# Patient Record
Sex: Female | Born: 1935 | Race: White | Hispanic: No | Marital: Married | Smoking: Former smoker
Health system: Southern US, Community
[De-identification: ages and names within clinical notes are randomized; demographics above are authoritative.]

## PROBLEM LIST (undated history)

## (undated) DIAGNOSIS — T4145XA Adverse effect of unspecified anesthetic, initial encounter: Secondary | ICD-10-CM

## (undated) DIAGNOSIS — E039 Hypothyroidism, unspecified: Secondary | ICD-10-CM

## (undated) DIAGNOSIS — E7800 Pure hypercholesterolemia, unspecified: Secondary | ICD-10-CM

## (undated) DIAGNOSIS — T8859XA Other complications of anesthesia, initial encounter: Secondary | ICD-10-CM

## (undated) DIAGNOSIS — E079 Disorder of thyroid, unspecified: Secondary | ICD-10-CM

## (undated) DIAGNOSIS — I10 Essential (primary) hypertension: Secondary | ICD-10-CM

## (undated) DIAGNOSIS — E785 Hyperlipidemia, unspecified: Secondary | ICD-10-CM

## (undated) DIAGNOSIS — M1990 Unspecified osteoarthritis, unspecified site: Secondary | ICD-10-CM

## (undated) DIAGNOSIS — G510 Bell's palsy: Secondary | ICD-10-CM

## (undated) HISTORY — PX: THYROID SURGERY: SHX805

## (undated) HISTORY — DX: Bell's palsy: G51.0

## (undated) HISTORY — PX: ABDOMINAL HYSTERECTOMY: SHX81

## (undated) HISTORY — PX: TONSILLECTOMY: SUR1361

---

## 1998-10-06 ENCOUNTER — Ambulatory Visit (HOSPITAL_COMMUNITY): Admission: RE | Admit: 1998-10-06 | Discharge: 1998-10-06 | Payer: Self-pay | Admitting: Endocrinology

## 1998-10-06 ENCOUNTER — Encounter: Payer: Self-pay | Admitting: Endocrinology

## 1998-11-03 ENCOUNTER — Encounter (INDEPENDENT_AMBULATORY_CARE_PROVIDER_SITE_OTHER): Payer: Self-pay | Admitting: Specialist

## 1998-11-03 ENCOUNTER — Ambulatory Visit (HOSPITAL_COMMUNITY): Admission: RE | Admit: 1998-11-03 | Discharge: 1998-11-03 | Payer: Self-pay

## 1999-10-14 ENCOUNTER — Emergency Department (HOSPITAL_COMMUNITY): Admission: EM | Admit: 1999-10-14 | Discharge: 1999-10-14 | Payer: Self-pay | Admitting: Emergency Medicine

## 1999-11-26 ENCOUNTER — Encounter: Admission: RE | Admit: 1999-11-26 | Discharge: 1999-11-26 | Payer: Self-pay | Admitting: Occupational Medicine

## 1999-11-26 ENCOUNTER — Encounter: Payer: Self-pay | Admitting: Occupational Medicine

## 2005-06-13 ENCOUNTER — Encounter: Admission: RE | Admit: 2005-06-13 | Discharge: 2005-06-13 | Payer: Self-pay | Admitting: Internal Medicine

## 2005-07-14 ENCOUNTER — Encounter: Admission: RE | Admit: 2005-07-14 | Discharge: 2005-07-14 | Payer: Self-pay | Admitting: Internal Medicine

## 2005-07-21 ENCOUNTER — Encounter: Admission: RE | Admit: 2005-07-21 | Discharge: 2005-07-21 | Payer: Self-pay | Admitting: Internal Medicine

## 2005-07-21 ENCOUNTER — Other Ambulatory Visit: Admission: RE | Admit: 2005-07-21 | Discharge: 2005-07-21 | Payer: Self-pay | Admitting: Interventional Radiology

## 2005-07-21 ENCOUNTER — Encounter (INDEPENDENT_AMBULATORY_CARE_PROVIDER_SITE_OTHER): Payer: Self-pay | Admitting: *Deleted

## 2005-09-09 ENCOUNTER — Ambulatory Visit (HOSPITAL_COMMUNITY): Admission: RE | Admit: 2005-09-09 | Discharge: 2005-09-10 | Payer: Self-pay | Admitting: General Surgery

## 2005-09-09 ENCOUNTER — Encounter (INDEPENDENT_AMBULATORY_CARE_PROVIDER_SITE_OTHER): Payer: Self-pay | Admitting: Specialist

## 2008-03-19 ENCOUNTER — Encounter: Admission: RE | Admit: 2008-03-19 | Discharge: 2008-03-19 | Payer: Self-pay | Admitting: Orthopedic Surgery

## 2008-03-20 ENCOUNTER — Ambulatory Visit (HOSPITAL_BASED_OUTPATIENT_CLINIC_OR_DEPARTMENT_OTHER): Admission: RE | Admit: 2008-03-20 | Discharge: 2008-03-21 | Payer: Self-pay | Admitting: Orthopedic Surgery

## 2010-04-15 LAB — BASIC METABOLIC PANEL
BUN: 14 mg/dL (ref 6–23)
Chloride: 105 mEq/L (ref 96–112)
GFR calc Af Amer: >60 mL/min (ref >60)
GFR calc non Af Amer: >60 mL/min (ref >60)
Potassium: 4.2 mEq/L (ref 3.5–5.1)
Sodium: 139 mEq/L (ref 135–145)

## 2010-04-15 LAB — POCT HEMOGLOBIN-HEMACUE: Hemoglobin: 13.1 g/dL (ref 12.0–15.0)

## 2010-05-18 NOTE — Op Note (Signed)
NAMEMarland Santiago  SHYANE, FOSSUM            ACCOUNT NO.:  [id number]   MEDICAL RECORD NO.:  [id number]          PATIENT TYPE:  AMB   LOCATION:  DSC                          FACILITY:  [facility]   PHYSICIAN:  Loreta Ave, M.D. DATE OF BIRTH:  10-27-1934   DATE OF PROCEDURE:  03/20/2008  DATE OF DISCHARGE:                               OPERATIVE REPORT   PREOPERATIVE DIAGNOSES:  Right shoulder impingement, rotator cuff tear,  and distal clavicle osteolysis.   POSTOPERATIVE DIAGNOSES:  Right shoulder impingement, rotator cuff tear,  and distal clavicle osteolysis with superior labral tear.   PROCEDURES:  1. Right shoulder exam under anesthesia arthroscopy, debridement of      labrum and rotator cuff.  2. Bursectomy.  3. Acromioplasty.  4. CA ligament release.  5. Excision of distal clavicle.  6. Arthroscopic assisted rotator cuff repair fiber weave suture x2,      swivel lock anchors x2.   SURGEON:  Loreta Ave, MD   ASSISTANT:  Genene Churn. Barry Dienes, [place], present throughout the entire case and  necessary for timely completion of procedure.   ANESTHESIA:  General.   BLOOD LOSS:  Minimal.   SPECIMENS:  None.   CULTURES:  None.   COMPLICATIONS:  None.   DRESSINGS:  Soft compressive shoulder immobilizer.   PROCEDURE:  The patient brought to the operating room, placed on  operating table in supine position.  After adequate anesthesia had been  obtained, shoulder examined.  Full motion, stable shoulder.  Placed in  beach-chair position on the shoulder positioner, prepped and draped in  usual sterile fashion.  Three portals created, one each anterior,  posterior, and lateral.  Shoulder entered with blunt obturator.  Arthroscope was introduced, shoulder distended and inspected.  Superior  complex tearing labrum debrided.  Biceps tendon, biceps anchor intact.  Some grade 2 and 3 focal change on the glenoid, but most of joint looked  fairly good.  Capsule ligamentous structures intact.   Rotator cuff  complete full-thickness tear of supraspinatus tendon throughout the  entire crescent region appeared very reparable.  Debrided.  Cannula  redirected subacromially.  Type II-III acromion.  Bursa was resected.  Acromioplasty to a type-I acromion released CA ligament with cautery.  Distal clavicle exposed.  Grade 4 changes with spurs.  Periarticular  spurs and lateral centimeter of clavicle were resected.  Adequacy of  decompression confirmed viewing throughout.  Tuberosity roughened to  bleeding bone.  The lateral portal was opened for a cannula.  The cuff  was captured with 2 horizontal fiber weave mattress sutures with the  Scorpion device.  The suture brought out anterior and lateral.  There  were then sequentially repaired down the tuberosity with pre-punching a  drill hole, attached to the swivel lock anchor and swivel lock anchor  firmly anchoring this down tensioning the suture.  At completion, nice  firm repair, watertight closure, and decompression confirmed throughout.  Instruments and fluid removed.  Portals shoulder and bursa injected  Marcaine.  Portals were closed with 4-0 nylon.  Sterile compressive  dressing applied.  Anesthesia reversed.  Brought to recovery room.  Tolerated  surgery well.  No complications.      Loreta Ave, M.D.  Electronically Signed     DFM/MEDQ  D:  03/20/2008  T:  03/21/2008  Job:  [id number]

## 2010-05-21 NOTE — Op Note (Signed)
NAMEMarland Kitchen  Tammie Santiago, Tammie Santiago            ACCOUNT NO.:  [id number]   MEDICAL RECORD NO.:  [id number]          PATIENT TYPE:  OIB   LOCATION:  1613                         FACILITY:  [facility]   PHYSICIAN:  Ollen Gross. Vernell Morgans, M.D. DATE OF BIRTH:  1934/11/02   DATE OF PROCEDURE:  09/09/2005  DATE OF DISCHARGE:  09/10/2005                                 OPERATIVE REPORT   PREOPERATIVE DIAGNOSIS:  Left thyroid mass.   POSTOPERATIVE DIAGNOSIS:  Left thyroid mass.   PROCEDURE:  Left thyroid lobectomy.   SURGEON:  Ollen Gross. Carolynne Edouard, M.D.   ASSISTANT:  Gita Kudo, M.D.   ANESTHESIA:  General endotracheal.   PROCEDURE:  After informed consent was obtained, the patient was brought to  the operating room, placed in supine position on the operating table.  After  adequate induction of general anesthesia, the patient's neck and chest area  were prepped with Betadine and draped in usual sterile manner.  Prior to  prepping, a roll was placed behind the patient's shoulders to extend the  neck a little bit.  The patient was then prepped and draped in usual sterile  manner.  A low transverse incision was made approximately one finger breadth  above the clavicles and two fingerbreadths above the sternal notch.  This  incision was carried was then made with a 15 blade knife and carried down  through the skin, subcutaneous tissue and platysma sharply with  electrocautery.  The platysma was then grasped with Allis clamps and  subplatysmal flaps were created superiorly and inferiorly.  A Mahorner  retractor was then deployed.  The dissection was then carried down sharply  with electrocautery along the midline and neck until the trachea was  identified.  The patient did not appear to have an isthmus of her thyroid  gland.  She had had previous minimally invasive parathyroidectomy of the  left inferior parathyroid adenoma in the past.  The patient did have some  scar tissue along the anterior and lateral  surface of the left lobe of the  thyroid gland because of this.  The strap muscles of the neck were retracted  laterally with [employer] retractor.  Some blunt dissection was carried out  along the anterior surface of the thyroid gland with a Kitner and some sharp  dissection also with the electrocautery to separate the muscles from the  anterior surface of the thyroid gland.  Once this was accomplished, a finger  was able to be inserted bluntly around the edge of the thyroid gland and the  thyroid gland was rotated medially and anteriorly.  As this was done several  small vessels on the lateral edge of the thyroid gland were controlled with  small vascular clips.  The superior pole of the thyroid gland was also  dissected circumferentially bluntly with a right-angle clamp and the  superior pole was controlled with 2-0 silk ties and medium vascular clip on  the stay side and the thyroid gland was divided between the two ties.  Once  this was accomplished, the thyroid gland was able to be rolled more medially  and anteriorly.  The area of the recurrent laryngeal nerve was identified  and care was taken to keep the dissection away from the structure.  It was  felt that we did identify a left superior parathyroid gland and this was  also kept well out of the dissection area.  The left lobe of the thyroid  gland was then mobilized off of the trachea sharply with electrocautery.  Once this was accomplished, the left lobe was removed from the patient and  sent to pathology for frozen sections which favored a benign process from  the pathologist.  The wound was then irrigated copious amounts of saline.  The area of the operative bed appeared to be completely hemostatic.  A small  piece of Surgicel was placed in the operative bed and pressure was held for  several minutes.  The bed was then reexamined and still felt to be  completely hemostatic.  The right lobe of the thyroid gland was palpated and   felt to be normal.  At this point the strap muscles were reapproximated with  interrupted 3-0 Vicryl stitches.  The platysma was also reapproximated with  interrupted 3-0 Vicryl stitches and the skin was closed with a running 4-0  Monocryl subcuticular stitch.  Benzoin, Steri-Strips and sterile dressings  were applied.  The patient tolerated well.  At the end of case all needle,  sponge and instrument counts were correct.  The patient was awakened and  taken to recovery in stable condition.      Ollen Gross. Vernell Morgans, M.D.  Electronically Signed     PST/MEDQ  D:  09/12/2005  T:  09/12/2005  Job:  [id number]

## 2010-06-17 ENCOUNTER — Ambulatory Visit
Admission: RE | Admit: 2010-06-17 | Discharge: 2010-06-17 | Disposition: A | Discharge status: Home / Self Care | Payer: Medicare Other | Source: Ambulatory Visit | Attending: Internal Medicine | Admitting: Internal Medicine

## 2010-06-17 ENCOUNTER — Other Ambulatory Visit: Payer: Self-pay | Admitting: Internal Medicine

## 2010-06-17 DIAGNOSIS — M25552 Pain in left hip: Secondary | ICD-10-CM

## 2011-06-06 ENCOUNTER — Other Ambulatory Visit: Payer: Self-pay | Admitting: Sports Medicine

## 2011-06-06 DIAGNOSIS — M545 Low back pain: Secondary | ICD-10-CM

## 2011-06-12 ENCOUNTER — Ambulatory Visit
Admission: RE | Admit: 2011-06-12 | Discharge: 2011-06-12 | Disposition: A | Discharge status: Home / Self Care | Payer: Medicare Other | Source: Ambulatory Visit | Attending: Sports Medicine | Admitting: Sports Medicine

## 2011-06-12 DIAGNOSIS — M545 Low back pain: Secondary | ICD-10-CM

## 2012-09-03 DIAGNOSIS — G510 Bell's palsy: Secondary | ICD-10-CM

## 2012-09-03 HISTORY — DX: Bell's palsy: G51.0

## 2012-09-08 ENCOUNTER — Emergency Department (HOSPITAL_COMMUNITY): Payer: Medicare Other

## 2012-09-08 ENCOUNTER — Emergency Department (HOSPITAL_COMMUNITY)
Admission: EM | Admit: 2012-09-08 | Discharge: 2012-09-08 | Disposition: A | Discharge status: Home / Self Care | Payer: Medicare Other | Attending: Emergency Medicine | Admitting: Emergency Medicine

## 2012-09-08 ENCOUNTER — Encounter (HOSPITAL_COMMUNITY): Payer: Self-pay | Admitting: Emergency Medicine

## 2012-09-08 DIAGNOSIS — E079 Disorder of thyroid, unspecified: Secondary | ICD-10-CM | POA: Insufficient documentation

## 2012-09-08 DIAGNOSIS — H9209 Otalgia, unspecified ear: Secondary | ICD-10-CM | POA: Insufficient documentation

## 2012-09-08 DIAGNOSIS — IMO0002 Reserved for concepts with insufficient information to code with codable children: Secondary | ICD-10-CM | POA: Insufficient documentation

## 2012-09-08 DIAGNOSIS — E7800 Pure hypercholesterolemia, unspecified: Secondary | ICD-10-CM | POA: Insufficient documentation

## 2012-09-08 DIAGNOSIS — G510 Bell's palsy: Secondary | ICD-10-CM | POA: Insufficient documentation

## 2012-09-08 DIAGNOSIS — I10 Essential (primary) hypertension: Secondary | ICD-10-CM | POA: Insufficient documentation

## 2012-09-08 HISTORY — DX: Essential (primary) hypertension: I10

## 2012-09-08 HISTORY — DX: Pure hypercholesterolemia, unspecified: E78.00

## 2012-09-08 HISTORY — DX: Disorder of thyroid, unspecified: E07.9

## 2012-09-08 LAB — BASIC METABOLIC PANEL
BUN: 15 mg/dL (ref 6–23)
CO2: 26 mEq/L (ref 19–32)
Chloride: 102 mEq/L (ref 96–112)
GFR calc Af Amer: >90 mL/min (ref >90)
Potassium: 3.7 mEq/L (ref 3.5–5.1)

## 2012-09-08 LAB — CBC WITH DIFFERENTIAL/PLATELET
Basophils Relative: 0 % (ref 0–1)
HCT: 36.9 % (ref 36.0–46.0)
Hemoglobin: 13.3 g/dL (ref 12.0–15.0)
Lymphocytes Relative: 12 % (ref 12–46)
MCHC: 36 g/dL (ref 30.0–36.0)
Monocytes Relative: 6 % (ref 3–12)
Neutro Abs: 5.5 10*3/uL (ref 1.7–7.7)
Neutrophils Relative %: 80 % — ABNORMAL HIGH (ref 43–77)
RBC: 4.14 MIL/uL (ref 3.87–5.11)
WBC: 6.9 10*3/uL (ref 4.0–10.5)

## 2012-09-08 MED ORDER — POLYVINYL ALCOHOL 1.4 % OP SOLN: 2.0000 [drp] | OPHTHALMIC | Status: DC | PRN

## 2012-09-08 MED ORDER — PREDNISONE 10 MG PO TABS: 40.0000 mg | ORAL_TABLET | Freq: Every day | ORAL | Status: DC

## 2012-09-08 NOTE — ED Notes (Signed)
ED MD made aware of pt's s/s and last seen normal time

## 2012-09-08 NOTE — ED Notes (Signed)
MD at bedside. 

## 2012-09-08 NOTE — ED Notes (Signed)
Pt. Stated, I think I've had a mini stroke during the night.  When i got up this am and ate breakfast that was fine, but when i drank coffee i noticed it was dripping out of the side of my mouth on the right side and my eye on the right side was watery.  i have had an ear infection which he gave me some ear drops.  No other problem notice by pt. Pt. Stated i got out and walked the cat then decided to come to the hospital.

## 2012-09-08 NOTE — ED Provider Notes (Signed)
CSN: [id number]     Arrival date & time 09/08/12  1132 History   First MD Initiated Contact with Patient 09/08/12 1156     Chief Complaint  Patient presents with  . Weakness   (Consider location/radiation/quality/duration/timing/severity/associated sxs/prior Treatment) The history is provided by the patient and the spouse.   78 year old female. Noted the right-sided facial drooping this morning exact onset of the time not clear believe it to be around 9:30 when she was drinking something and noticed that it was truly in and of her her dripping out of the right side of her mouth. Patient had been up for a little while at this point in time. Husband feels that his speech is slightly slurred. Denies any headache visual changes arm weakness leg weakness or any numbness. No history of stroke in the past.  Past Medical History  Diagnosis Date  . High cholesterol   . Hypertension   . Thyroid disease    History reviewed. No pertinent past surgical history. No family history on file. History  Substance Use Topics  . Smoking status: Not on file  . Smokeless tobacco: Not on file  . Alcohol Use: Yes   OB History   Grav Para Term Preterm Abortions TAB SAB Ect Mult Living                 Review of Systems  Constitutional: Negative for fever.  HENT: Positive for ear pain.   Eyes: Negative for redness.  Respiratory: Negative for shortness of breath.   Cardiovascular: Negative for chest pain.  Gastrointestinal: Negative for abdominal pain.  Genitourinary: Negative for dysuria.  Musculoskeletal: Negative for back pain.  Skin: Negative for rash.  Neurological: Positive for facial asymmetry, speech difficulty and weakness. Negative for headaches.  Hematological: Does not bruise/bleed easily.  Psychiatric/Behavioral: Negative for confusion.    Allergies  Review of patient's allergies indicates not on file.  Home Medications   Current Outpatient Rx  Name  Route  Sig  Dispense  Refill  .  polyvinyl alcohol (LIQUIFILM TEARS) 1.4 % ophthalmic solution   Right Eye   Place 2 drops into the right eye as needed.   15 mL   2   . predniSONE (DELTASONE) 10 MG tablet   Oral   Take 4 tablets (40 mg total) by mouth daily.   20 tablet   0    BP 142/59  Pulse 86  Temp(Src) 99 F (37.2 C) (Oral)  Resp 21  SpO2 96% Physical Exam  Nursing note and vitals reviewed. Constitutional: She is oriented to person, place, and time. She appears well-developed and well-nourished. No distress.  HENT:  Head: Normocephalic and atraumatic.  Right Ear: External ear normal.  Left Ear: External ear normal.  Mouth/Throat: Oropharynx is clear and moist.  Eyes: Conjunctivae and EOM are normal. Pupils are equal, round, and reactive to light.  Drooping of the right eyelid noted  Neck: Normal range of motion.  Cardiovascular: Normal rate, regular rhythm and normal heart sounds.   Pulmonary/Chest: Effort normal and breath sounds normal.  Abdominal: Soft. Bowel sounds are normal. There is no tenderness.  Musculoskeletal: Normal range of motion.  Lymphadenopathy:    She has no cervical adenopathy.  Neurological: She is alert and oriented to person, place, and time. She exhibits normal muscle tone. Coordination normal.  Right facial nerve partial paralysis noted. Does involve the 4 head as well. No other neurological deficits.  Skin: Skin is warm. No rash noted.    ED  Course  Procedures (including critical care time) Labs Review Labs Reviewed  CBC WITH DIFFERENTIAL - Abnormal; Notable for the following:    Neutrophils Relative % 80 (*)    All other components within normal limits  BASIC METABOLIC PANEL - Abnormal; Notable for the following:    Glucose, Bld 137 (*)    GFR calc non Af Amer 81 (*)    All other components within normal limits   Results for orders placed during the hospital encounter of 09/08/12  CBC WITH DIFFERENTIAL      Result Value Range   WBC 6.9  4.0 - 10.5 K/uL   RBC  4.14  3.87 - 5.11 MIL/uL   Hemoglobin 13.3  12.0 - 15.0 g/dL   HCT 78.2  95.6 - 21.3 %   MCV 89.1  78.0 - 100.0 fL   MCH 32.1  26.0 - 34.0 pg   MCHC 36.0  30.0 - 36.0 g/dL   RDW 08.6  57.8 - 46.9 %   Platelets 201  150 - 400 K/uL   Neutrophils Relative % 80 (*) 43 - 77 %   Neutro Abs 5.5  1.7 - 7.7 K/uL   Lymphocytes Relative 12  12 - 46 %   Lymphs Abs 0.8  0.7 - 4.0 K/uL   Monocytes Relative 6  3 - 12 %   Monocytes Absolute 0.4  0.1 - 1.0 K/uL   Eosinophils Relative 2  0 - 5 %   Eosinophils Absolute 0.1  0.0 - 0.7 K/uL   Basophils Relative 0  0 - 1 %   Basophils Absolute 0.0  0.0 - 0.1 K/uL  BASIC METABOLIC PANEL      Result Value Range   Sodium 140  135 - 145 mEq/L   Potassium 3.7  3.5 - 5.1 mEq/L   Chloride 102  96 - 112 mEq/L   CO2 26  19 - 32 mEq/L   Glucose, Bld 137 (*) 70 - 99 mg/dL   BUN 15  6 - 23 mg/dL   Creatinine, Ser 6.29  0.50 - 1.10 mg/dL   Calcium 9.5  8.4 - 52.8 mg/dL   GFR calc non Af Amer 81 (*) >90 mL/min   GFR calc Af Amer >90  >90 mL/min    . Date: 09/08/2012  Rate: 109  Rhythm: sinus tachycardia  QRS Axis: normal  Intervals: normal  ST/T Wave abnormalities: nonspecific ST/T changes  Conduction Disutrbances:none  Narrative Interpretation:   Old EKG Reviewed: none available    Imaging Review Ct Head Wo Contrast  09/08/2012   *RADIOLOGY REPORT*  Clinical Data: Weakness. Possible right facial droop.  CT HEAD WITHOUT CONTRAST  Technique:  Contiguous axial images were obtained from the base of the skull through the vertex without contrast.  Comparison: None.  Findings: Ventricles, cisterns and others CSF spaces are within normal.  There is no mass, mass effect, shift midline structures or acute hemorrhage.  There is no definite evidence to suggest acute infarction.  Remaining bones and soft tissues are unremarkable.  IMPRESSION: No acute intracranial findings.   Original Report Authenticated By: Elberta Fortis, M.D.    MDM   1. Bell's palsy    The  patient's presentation and findings consistent with right-sided facial nerve palsy. Otherwise known as Bell's palsy. Head CT negative patient has no other neurological symptoms other than a partial paralysis on the right side of her face to include the 4 head. Patient will be treated with prednisone his primary care Dr.  followup neurology referral provided with tears provided for the right eye. Currently patient is able to close her right eye patient wears a contact in the left but not the right so removal of the contact is not necessary. Patient will return for any newer worse symptoms.    Shelda Jakes, MD 09/08/12 [id number]

## 2012-12-21 ENCOUNTER — Other Ambulatory Visit: Payer: Self-pay | Admitting: Orthopedic Surgery

## 2012-12-21 DIAGNOSIS — M25512 Pain in left shoulder: Secondary | ICD-10-CM

## 2012-12-30 ENCOUNTER — Ambulatory Visit
Admission: RE | Admit: 2012-12-30 | Discharge: 2012-12-30 | Disposition: A | Discharge status: Home / Self Care | Payer: Medicare Other | Source: Ambulatory Visit | Attending: Orthopedic Surgery | Admitting: Orthopedic Surgery

## 2012-12-30 DIAGNOSIS — M25512 Pain in left shoulder: Secondary | ICD-10-CM

## 2013-01-03 HISTORY — PX: OTHER SURGICAL HISTORY: SHX169

## 2013-06-04 ENCOUNTER — Ambulatory Visit (INDEPENDENT_AMBULATORY_CARE_PROVIDER_SITE_OTHER): Payer: Medicare Other

## 2013-06-04 VITALS — Resp 15 | Ht 64.0 in | Wt 155.0 lb

## 2013-06-04 DIAGNOSIS — R52 Pain, unspecified: Secondary | ICD-10-CM

## 2013-06-04 DIAGNOSIS — M778 Other enthesopathies, not elsewhere classified: Secondary | ICD-10-CM

## 2013-06-04 DIAGNOSIS — M7750 Other enthesopathy of unspecified foot: Secondary | ICD-10-CM

## 2013-06-04 DIAGNOSIS — M2010 Hallux valgus (acquired), unspecified foot: Secondary | ICD-10-CM

## 2013-06-04 DIAGNOSIS — M779 Enthesopathy, unspecified: Secondary | ICD-10-CM

## 2013-06-04 NOTE — Progress Notes (Signed)
   Subjective:    Patient ID: Tammie Santiago, female    DOB: 1934-08-12, 79 y.o.   MRN: [id number]  HPI Comments: N lump L left 1st MPJ lateral D over 3 years O gradually increasing in size and tenderness C soft lump, painful  A enclosed shoes T wider shoes     Review of Systems  All other systems reviewed and are negative.      Objective:   Physical Exam 79 year old female presents this time well-developed well-nourished right history with a complaint of painful first MTP area left foot there's soft tissue cystic change over that area as well the dura bursal sac or ganglion cyst of the first MTP area left the painful tender symptomatically walking to his enclosed shoes.  Lower extremity objective findings as follows vascular status appears to be intact with pedal pulses palpable DP and PT +2/4 capillary fill time 3-4 seconds skin temperature warm turgor normal no edema rubor pallor or varicosities noted neurologically epicritic and proprioceptive sensations intact and symmetric bilateral is normal plantar response DTRs not listed neurologically skin color pigment hair growth are normal orthopedic biomechanical exam rectus foot type ankle mid tarsus subtalar joint motion is normal there is normal plantar response DTRs were noted as mentioned on orthopedic exam x-rays reveal notable HAV deformity I am angle greater than 12-14 hallux abductus angle greater than 2530 there is prominence of hypertrophy and cystic changes the first metatarsal head dorsally and medially there is also palpable soft tissue mass or bursal sac over the first MTP area left foot is bunion although not as severe there is also some flexible digital contractures lateral digits.       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 of her is a ganglion cyst at this time dispensed some bunion shields and cushion pads and silicone bunion shield was dispensed  literature on bunion surgery given to patient for to consider reappointed or continues for future followup and surgical consultation she is ready patient is a candidate for possible surgical intervention with Eliberto Ivory bunionectomy at some point in the future in the interim maintain accommodative shoes and entire constrictive shoes also advised that she can have conservative care white coming shoes steroid injections be beneficial at this time however the did dispense some padding reappointed to me for further consultation surgical option discussion. Next  Alvan Dame DPM

## 2013-06-04 NOTE — Patient Instructions (Signed)
Bunionectomy A bunionectomy is surgery to remove a bunion. A bunion is an enlargement of the joint at the base of the big toe. It is made up of bone and soft tissue on the inside part of the joint. Over time, a painful lump appears on the inside of the joint. The big toe begins to point inward toward the second toe. New bone growth can occur and a bone spur may form. The pain eventually causes difficulty walking. A bunion usually results from inflammation caused by the irritation of poorly fitting shoes. It often begins later in life. A bunionectomy is performed when nonsurgical treatment no longer works. When surgery is needed, the extent of the procedure will depend on the degree of deformity of the foot. Your surgeon will discuss with you the different procedures and what will work best for you depending on your age and health. LET YOUR CAREGIVER KNOW ABOUT:   Previous problems with anesthetics or medicines used to numb the skin.  Allergies to dyes, iodine, foods, and/or latex.  Medicines taken including herbs, eye drops, prescription medicines (especially medicines used to "thin the blood"), aspirin and other over-the-counter medicines, and steroids (by mouth or as a cream).  History of bleeding or blood problems.  Possibility of pregnancy, if this applies.  History of blood clots in your legs and/or lungs .  Previous surgery.  Other important health problems. RISKS AND COMPLICATIONS   Infection.  Pain.  Nerve damage.  Possibility that the bunion will recur. BEFORE THE PROCEDURE  You should be present 60 minutes prior to your procedure or as directed.  PROCEDURE  Surgery is often done so that you can go home the same day (outpatient). It may be done in a hospital or in an outpatient surgical center. An anesthetic will be used to help you sleep during the procedure. Sometimes, a spinal anesthetic is used to make you numb below the waist. A cut (incision) is made over the swollen  area at the first joint of the big toe. The enlarged lump will be removed. If there is a need to reposition the bones of the big toe, this may require more than 1 incision. The bone itself may need to be cut. Screws and wires may be used in the repair. These can be removed at a later date. In severe cases, the entire joint may need to be removed and a joint replacement inserted. When done, the incision is closed with stitches (sutures). Skin adhesive strips may be added for reinforcement. They help hold the incision closed.  AFTER THE PROCEDURE  Compression bandages (dressings) are then wrapped around the wound. This helps to keep the foot in alignment and reduce swelling. Your foot will be monitored for bleeding and swelling. You will need to stay for a few hours in the recovery area before being discharged. This allows time for the anesthesia to wear off. You will be discharged home when you are awake, stable, and doing well. HOME CARE INSTRUCTIONS   You can expect to return to normal activities within 6 to 8 weeks after surgery. The foot is at increased risk for swelling for several months. When you can expect to bear weight on the operated foot will depend on the extent of your surgery. The milder the deformity, the less tissue is removed and the sooner the return to normal activity level. During the recovery period, a special shoe, boot, or cast may be worn to accommodate the surgical bandage and to help provide stability   to the foot.  Once you are home, an ice pack applied to the operative site may help with discomfort and keep swelling down. Stop using the ice if it causes discomfort.  Keep your feet raised (elevated) when possible to lessen swelling.  If you have an elastic bandage on your foot and you have numbness, tingling, or your foot becomes cold and blue, adjust the bandage to make it comfortable.  Change dressings as directed.  Keep the wound dry and clean. The wound may be washed  gently with soap and water. Gently blot dry without rubbing. Do not take baths or use swimming pools or hot tubs for 10 days, or as instructed by your caregiver.  Only take over-the-counter or prescription medicines for pain, discomfort, or fever as directed by your caregiver.  You may continue a normal diet as directed.  For activity, use crutches with no weight bearing or your orthopedic shoe as directed. Continue to use crutches or a cane as directed until you can stand without causing pain. SEEK MEDICAL CARE IF:   You have redness, swelling, bruising, or increasing pain in the wound.  There is pus coming from the wound.  You have drainage from a wound lasting longer than 1 day.  You have an oral temperature above 102 F (38.9 C).  You notice a bad smell coming from the wound or dressing.  The wound breaks open after sutures have been removed.  You develop dizzy episodes or fainting while standing.  You have persistent nausea or vomiting.  Your toes become cold.  Pain is not relieved with medicines. SEEK IMMEDIATE MEDICAL CARE IF:   You develop a rash.  You have difficulty breathing.  You develop any reaction or side effects to medicines given.  Your toes are numb or blue, or you have severe pain. MAKE SURE YOU:   Understand these instructions.  Will watch your condition.  Will get help right away if you are not doing well or get worse. Document Released: 12/03/2004 Document Revised: 03/14/2011 Document Reviewed: 01/08/2007 [employer] Patient Information 2014 [employer].  

## 2013-07-30 ENCOUNTER — Other Ambulatory Visit (HOSPITAL_COMMUNITY): Payer: Self-pay | Admitting: Internal Medicine

## 2013-07-30 DIAGNOSIS — R0989 Other specified symptoms and signs involving the circulatory and respiratory systems: Secondary | ICD-10-CM

## 2013-08-05 ENCOUNTER — Ambulatory Visit (HOSPITAL_COMMUNITY)
Admission: RE | Admit: 2013-08-05 | Discharge: 2013-08-05 | Disposition: A | Discharge status: Home / Self Care | Payer: Medicare Other | Source: Ambulatory Visit | Attending: Internal Medicine | Admitting: Internal Medicine

## 2013-08-05 DIAGNOSIS — I6529 Occlusion and stenosis of unspecified carotid artery: Secondary | ICD-10-CM | POA: Diagnosis not present

## 2013-08-05 DIAGNOSIS — R0989 Other specified symptoms and signs involving the circulatory and respiratory systems: Secondary | ICD-10-CM

## 2013-08-05 NOTE — Progress Notes (Signed)
*  PRELIMINARY RESULTS* Vascular Ultrasound Carotid Duplex (Doppler) has been completed.  Preliminary findings: Bilateral:  1-39% ICA stenosis.  Vertebral artery flow is antegrade.   Increased velocities and calcific plaque noted in the left ECA, consistent with significant stenosis. This may be the source of the carotid bruit.   Farrel DemarkJill Eunice, RDMS, RVT  08/05/2013, 10:25 AM

## 2013-09-23 ENCOUNTER — Encounter: Payer: Self-pay | Admitting: Vascular Surgery

## 2013-09-24 ENCOUNTER — Encounter: Payer: Self-pay | Admitting: Vascular Surgery

## 2013-09-24 ENCOUNTER — Ambulatory Visit (INDEPENDENT_AMBULATORY_CARE_PROVIDER_SITE_OTHER): Payer: Medicare Other | Admitting: Vascular Surgery

## 2013-09-24 VITALS — BP 153/79 | HR 66 | Resp 16 | Ht 64.0 in | Wt 175.0 lb

## 2013-09-24 DIAGNOSIS — R0989 Other specified symptoms and signs involving the circulatory and respiratory systems: Secondary | ICD-10-CM | POA: Insufficient documentation

## 2013-09-24 NOTE — Addendum Note (Signed)
Addended by: Sharee Pimple on: 09/24/2013 04:46 PM   Modules accepted: Orders

## 2013-09-24 NOTE — Progress Notes (Signed)
Subjective:     Patient ID: Tammie Santiago, female   DOB: 02/25/1934, 79 y.o.   MRN: [id number]  HPI this 79 year old female was referred by Dr. Burton Apley for evaluation of carotid occlusive disease. Dr. Su Hilt heard a bruit in the left neck and ordered ultrasound exam in August of 2015. This revealed a moderate left external carotid stenosis but minimal bilateral internal carotid stenosis-less than 40%. Patient has no history of stroke, TIA, lateralizing weakness, dysphasia, procedures, diplopia, blurred vision, or syncope. She did have an episode of Bell's palsy 1-1/2 years ago which completely resolved. She takes aspirin every other day-81 mg.  Past Medical History  Diagnosis Date  . High cholesterol   . Hypertension   . Thyroid disease   . Bell's palsy 09/2012    right sided    History  Substance Use Topics  . Smoking status: Former Games developer  . Smokeless tobacco: Not on file  . Alcohol Use: Yes    Family History  Problem Relation Age of Onset  . Heart disease Father     Allergies  Allergen Reactions  . Codeine Nausea Only    Current outpatient prescriptions:amLODipine (NORVASC) 10 MG tablet, Take 20 mg by mouth daily. , Disp: , Rfl: ;  aspirin 81 MG tablet, Take 81 mg by mouth every other day., Disp: , Rfl: ;  atorvastatin (LIPITOR) 20 MG tablet, Take 20 mg by mouth every other day., Disp: , Rfl: ;  CALCIUM-VITAMIN D PO, Take 1 tablet by mouth daily., Disp: , Rfl:  levothyroxine (SYNTHROID, LEVOTHROID) 75 MCG tablet, Take 75 mcg by mouth daily before breakfast., Disp: , Rfl: ;  Polyethyl Glycol-Propyl Glycol (SYSTANE) 0.4-0.3 % SOLN, Apply 1 drop to eye 2 (two) times daily as needed (for dry eyes)., Disp: , Rfl:   BP 153/79  Pulse 66  Resp 16  Ht  (1.626 m)  Wt 175 lb (79.379 kg)  BMI 30.02 kg/m2  Body mass index is 30.02 kg/(m^2).           Review of Systems denies chest pain, dyspnea on exertion, PND, orthopnea, hemoptysis, claudication. All  systems negative complete review of systems other than the episode of Bell's palsy as noted above     Objective:   Physical Exam BP 153/79  Pulse 66  Resp 16  Ht  (1.626 m)  Wt 175 lb (79.379 kg)  BMI 30.02 kg/m2  Gen.-alert and oriented x3 in no apparent distress HEENT normal for age Lungs no rhonchi or wheezing Cardiovascular regular rhythm no murmurs carotid pulses 3+ palpable soft short bruit on left Abdomen soft nontender no palpable masses Musculoskeletal free of  major deformities Skin clear -no rashes Neurologic normal Lower extremities 3+ femoral and dorsalis pedis pulses palpable bilaterally with no edema  Today I reviewed the carotid duplex exam report. There was a velocity of 267 cm/s in the left external carotid consistent with a 60-70% stenosis. There is no evidence of flow reduction of significance in the internal or common carotid arteries bilaterally       Assessment:     Left external carotid stenosis-of no concern No evidence of disc significant internal or common carotid stenosis-asymptomatic    Plan:     Return in one year for followup carotid duplex exam unless patient develops symptoms in the interim Continue aspirin

## 2014-04-22 DIAGNOSIS — L820 Inflamed seborrheic keratosis: Secondary | ICD-10-CM | POA: Diagnosis not present

## 2014-04-22 DIAGNOSIS — Z85820 Personal history of malignant melanoma of skin: Secondary | ICD-10-CM | POA: Diagnosis not present

## 2014-04-22 DIAGNOSIS — Z08 Encounter for follow-up examination after completed treatment for malignant neoplasm: Secondary | ICD-10-CM | POA: Diagnosis not present

## 2014-04-22 DIAGNOSIS — L603 Nail dystrophy: Secondary | ICD-10-CM | POA: Diagnosis not present

## 2014-04-22 DIAGNOSIS — L570 Actinic keratosis: Secondary | ICD-10-CM | POA: Diagnosis not present

## 2014-04-22 DIAGNOSIS — Z1283 Encounter for screening for malignant neoplasm of skin: Secondary | ICD-10-CM | POA: Diagnosis not present

## 2014-10-14 ENCOUNTER — Ambulatory Visit: Payer: Medicare Other | Admitting: Family

## 2014-10-14 ENCOUNTER — Other Ambulatory Visit (HOSPITAL_COMMUNITY): Payer: Medicare Other

## 2014-10-15 ENCOUNTER — Ambulatory Visit: Payer: Self-pay | Admitting: Family

## 2014-10-15 ENCOUNTER — Encounter: Payer: Self-pay | Admitting: Family

## 2014-10-15 ENCOUNTER — Encounter (HOSPITAL_COMMUNITY): Payer: Self-pay

## 2014-10-17 ENCOUNTER — Encounter: Payer: Self-pay | Admitting: Family

## 2014-10-17 ENCOUNTER — Ambulatory Visit (HOSPITAL_COMMUNITY)
Admission: RE | Admit: 2014-10-17 | Discharge: 2014-10-17 | Disposition: A | Discharge status: Home / Self Care | Payer: Medicare Other | Source: Ambulatory Visit | Attending: Family | Admitting: Family

## 2014-10-17 ENCOUNTER — Ambulatory Visit (INDEPENDENT_AMBULATORY_CARE_PROVIDER_SITE_OTHER): Payer: Medicare Other | Admitting: Family

## 2014-10-17 VITALS — BP 131/69 | HR 74 | Temp 97.4°F | Resp 16 | Ht 64.0 in | Wt 178.0 lb

## 2014-10-17 DIAGNOSIS — R0989 Other specified symptoms and signs involving the circulatory and respiratory systems: Secondary | ICD-10-CM | POA: Insufficient documentation

## 2014-10-17 DIAGNOSIS — I6523 Occlusion and stenosis of bilateral carotid arteries: Secondary | ICD-10-CM

## 2014-10-17 DIAGNOSIS — E7800 Pure hypercholesterolemia, unspecified: Secondary | ICD-10-CM | POA: Insufficient documentation

## 2014-10-17 DIAGNOSIS — I10 Essential (primary) hypertension: Secondary | ICD-10-CM | POA: Diagnosis not present

## 2014-10-17 NOTE — Progress Notes (Signed)
Filed Vitals:   10/17/14 1452 10/17/14 1455 10/17/14 1502  BP: 143/70 138/69 131/69  Pulse: 73 71 74  Temp:  97.4 F (36.3 C)   TempSrc:  Oral   Resp:  16   Height:  5\' 4"  (1.626 m)   Weight:  178 lb (80.74 kg)   SpO2:  98%

## 2014-10-17 NOTE — Patient Instructions (Signed)
Stroke Prevention Some medical conditions and behaviors are associated with an increased chance of having a stroke. You may prevent a stroke by making healthy choices and managing medical conditions. HOW CAN I REDUCE MY RISK OF HAVING A STROKE?   Stay physically active. Get at least 30 minutes of activity on most or all days.  Do not smoke. It may also be helpful to avoid exposure to secondhand smoke.  Limit alcohol use. Moderate alcohol use is considered to be:  No more than 2 drinks per day for men.  No more than 1 drink per day for nonpregnant women.  Eat healthy foods. This involves:  Eating 5 or more servings of fruits and vegetables a day.  Making dietary changes that address high blood pressure (hypertension), high cholesterol, diabetes, or obesity.  Manage your cholesterol levels.  Making food choices that are high in fiber and low in saturated fat, trans fat, and cholesterol may control cholesterol levels.  Take any prescribed medicines to control cholesterol as directed by your health care provider.  Manage your diabetes.  Controlling your carbohydrate and sugar intake is recommended to manage diabetes.  Take any prescribed medicines to control diabetes as directed by your health care provider.  Control your hypertension.  Making food choices that are low in salt (sodium), saturated fat, trans fat, and cholesterol is recommended to manage hypertension.  Ask your health care provider if you need treatment to lower your blood pressure. Take any prescribed medicines to control hypertension as directed by your health care provider.  If you are 18-39 years of age, have your blood pressure checked every 3-5 years. If you are 40 years of age or older, have your blood pressure checked every year.  Maintain a healthy weight.  Reducing calorie intake and making food choices that are low in sodium, saturated fat, trans fat, and cholesterol are recommended to manage  weight.  Stop drug abuse.  Avoid taking birth control pills.  Talk to your health care provider about the risks of taking birth control pills if you are over 35 years old, smoke, get migraines, or have ever had a blood clot.  Get evaluated for sleep disorders (sleep apnea).  Talk to your health care provider about getting a sleep evaluation if you snore a lot or have excessive sleepiness.  Take medicines only as directed by your health care provider.  For some people, aspirin or blood thinners (anticoagulants) are helpful in reducing the risk of forming abnormal blood clots that can lead to stroke. If you have the irregular heart rhythm of atrial fibrillation, you should be on a blood thinner unless there is a good reason you cannot take them.  Understand all your medicine instructions.  Make sure that other conditions (such as anemia or atherosclerosis) are addressed. SEEK IMMEDIATE MEDICAL CARE IF:   You have sudden weakness or numbness of the face, arm, or leg, especially on one side of the body.  Your face or eyelid droops to one side.  You have sudden confusion.  You have trouble speaking (aphasia) or understanding.  You have sudden trouble seeing in one or both eyes.  You have sudden trouble walking.  You have dizziness.  You have a loss of balance or coordination.  You have a sudden, severe headache with no known cause.  You have new chest pain or an irregular heartbeat. Any of these symptoms may represent a serious problem that is an emergency. Do not wait to see if the symptoms will   go away. Get medical help at once. Call your local emergency services (911 in [place] Do not drive yourself to the hospital.   This information is not intended to replace advice given to you by your health care provider. Make sure you discuss any questions you have with your health care provider.   Document Released: 01/28/2004 Document Revised: 01/10/2014 Document Reviewed:  06/22/2012 Elsevier Interactive Patient Education 2016 [employer]  

## 2014-10-17 NOTE — Progress Notes (Signed)
Established Carotid Patient   History of Present Illness  Tammie Santiago is a 80 y.o. female patient whom Dr. Hart Rochester saw 09/24/13 on referral.   The pt was referred by Dr. Burton Apley for evaluation of carotid occlusive disease. Dr. Su Hilt heard a bruit in the left neck and ordered ultrasound exam in August of 2015. This revealed a moderate left external carotid stenosis but minimal bilateral internal carotid stenosis-less than 40%. Patient has no history of stroke, TIA, lateralizing weakness, dysphasia, diplopia, blurred vision, or syncope. She did have an episode of Bell's palsy in 2013 which completely resolved. She takes aspirin every other day-81 mg. She returns today for follow up. She stays physically active, exercises most days of the week.  The patient denies New Medical or Surgical History.  Pt Diabetic: no Pt smoker: non-smoker  Pt meds include: Statin : yes ASA: yes Other anticoagulants/antiplatelets: no   Past Medical History  Diagnosis Date  . High cholesterol   . Hypertension   . Thyroid disease   . Bell's palsy 09/2012    right sided    Social History Social History  Substance Use Topics  . Smoking status: Former Games developer  . Smokeless tobacco: Never Used  . Alcohol Use: Yes    Family History Family History  Problem Relation Age of Onset  . Heart disease Mother     After age 78- CHF    Surgical History Past Surgical History  Procedure Laterality Date  . Rotator cuff surgery Left 01/2013  . Abdominal hysterectomy    . Tonsillectomy    . Thyroid surgery      Allergies  Allergen Reactions  . Codeine Nausea Only    Current Outpatient Prescriptions  Medication Sig Dispense Refill  . amLODipine (NORVASC) 10 MG tablet Take 20 mg by mouth daily.     Marland Kitchen aspirin 81 MG tablet Take 81 mg by mouth every other day.    Marland Kitchen atorvastatin (LIPITOR) 20 MG tablet Take 20 mg by mouth every other day.    Marland Kitchen CALCIUM-VITAMIN D PO Take 1 tablet by mouth daily.     Marland Kitchen levothyroxine (SYNTHROID, LEVOTHROID) 75 MCG tablet Take 75 mcg by mouth daily before breakfast.    . Polyethyl Glycol-Propyl Glycol (SYSTANE) 0.4-0.3 % SOLN Apply 1 drop to eye 2 (two) times daily as needed (for dry eyes).     No current facility-administered medications for this visit.    Review of Systems : See HPI for pertinent positives and negatives.  Physical Examination  Filed Vitals:   10/17/14 1452 10/17/14 1455 10/17/14 1502  BP: 143/70 138/69 131/69  Pulse: 73 71 74  Temp:  97.4 F (36.3 C)   TempSrc:  Oral   Resp:  16   Height:   (1.626 m)   Weight:  178 lb (80.74 kg)   SpO2:  98%    Body mass index is 30.54 kg/(m^2).  General: WDWN female in NAD GAIT: normal Eyes: PERRLA Pulmonary:  Non-labored, CTAB, no  Rales, no rhonchi, & no wheezing.  Cardiac: regular rhythm, no detected murmur.  VASCULAR EXAM Carotid Bruits Right Left   Negative Negative    Aorta is not palpable. Radial pulses are 2+ palpable and equal.  LE Pulses Right Left       POPLITEAL  not palpable   not palpable       POSTERIOR TIBIAL  2+ palpable   2+ palpable        DORSALIS PEDIS      ANTERIOR TIBIAL 2+ palpable  not palpable     Gastrointestinal: soft, nontender, BS WNL, no r/g,  no palpable masses.  Musculoskeletal: no muscle atrophy/wasting. M/S 5/5 throughout, extremities without ischemic changes.  Neurologic: A&O X 3; Appropriate Affect, Speech is normal CN 2-12 intact, pain and light touch intact in extremities, Motor exam as listed above.   Non-Invasive Vascular Imaging CAROTID DUPLEX 10/17/2014   Right ICA: 1 - 39 % stenosis. Left ICA: 1 - 39 % stenosis. No significant stenosis of the right external or bilateral CCA. Greater than 50% left external carotid artery stenosis.  No previous duplex from this facility for comparison.     Assessment: Keenan BachelorMarcia F Nevins is a 80 y.o. female with a left carotid bruit. Her PCP ordered an ultrasound exam in August of 2015. This revealed a moderate left external carotid stenosis but minimal bilateral internal carotid stenosis-less than 40%.  The patient has no history of stroke or TIA. Today's carotid duplex suggests minimal bilateral ICA stenosis and greater than 50% stenosis of the left ECA. Fortunately she does not have DM and never used tobacco. She is on ASA and a statin.   Plan: Follow-up in 1 year with Carotid Duplex scan.   I discussed in depth with the patient the nature of atherosclerosis, and emphasized the importance of maximal medical management including strict control of blood pressure, blood glucose, and lipid levels, obtaining regular exercise, and continued cessation of smoking.  The patient is aware that without maximal medical management the underlying atherosclerotic disease process will progress, limiting the benefit of any interventions. The patient was given information about stroke prevention and what symptoms should prompt the patient to seek immediate medical care. Thank you for allowing us to participate in this patient's care.  Charisse MarchSuzanne Ayahna Solazzo, RN, MSN, FNP-C [facility] Office: [phone]  Clinic Physician: Edilia BoDickson  10/17/2014 3:24 PM

## 2014-10-20 NOTE — Addendum Note (Signed)
Addended by: Adria DillELDRIDGE-LEWIS, Talesha Ellithorpe L on: 10/20/2014 03:48 PM   Modules accepted: Orders

## 2014-10-28 ENCOUNTER — Ambulatory Visit
Admission: RE | Admit: 2014-10-28 | Discharge: 2014-10-28 | Disposition: A | Discharge status: Home / Self Care | Payer: Medicare Other | Source: Ambulatory Visit | Attending: Internal Medicine | Admitting: Internal Medicine

## 2014-10-28 ENCOUNTER — Other Ambulatory Visit: Payer: Self-pay | Admitting: Internal Medicine

## 2014-10-28 DIAGNOSIS — M25552 Pain in left hip: Secondary | ICD-10-CM

## 2015-01-07 NOTE — H&P (Signed)
TOTAL HIP ADMISSION H&P  Patient is admitted for left total hip arthroplasty, anterior approach.  Subjective:  Chief Complaint:   Left hip primary OA / pain  HPI: Tammie Santiago, 81 y.o. female, has a history of pain and functional disability in the left hip(s) due to arthritis and patient has failed non-surgical conservative treatments for greater than 12 weeks to include NSAID's and/or analgesics and activity modification.  Onset of symptoms was gradual starting ~4 years ago with gradually worsening course since that time.The patient noted no past surgery on the left hip(s).  Patient currently rates pain in the left hip at 7 out of 10 with activity. Patient has worsening of pain with activity and weight bearing, trendelenberg gait, pain that interfers with activities of daily living and pain with passive range of motion. Patient has evidence of periarticular osteophytes and joint space narrowing by imaging studies. This condition presents safety issues increasing the risk of falls.  There is no current active infection.   Risks, benefits and expectations were discussed with the patient.  Risks including but not limited to the risk of anesthesia, blood clots, nerve damage, blood vessel damage, failure of the prosthesis, infection and up to and including death.  Patient understand the risks, benefits and expectations and wishes to proceed with surgery.   PCP: Lorenda PeckOBERTS, RONALD WAYNE, MD  D/C Plans:      Home with HHPT  Post-op Meds:       No Rx given  Tranexamic Acid:      To be given - IV  Decadron:      Is to be given  FYI:     ASA post-op  Tramadol / APAP    Patient Active Problem List   Diagnosis Date Noted  . Carotid artery bruit 09/24/2013   Past Medical History  Diagnosis Date  . High cholesterol   . Hypertension   . Thyroid disease   . Bell's palsy 09/2012    right sided    Past Surgical History  Procedure Laterality Date  . Rotator cuff surgery Left 01/2013  .  Abdominal hysterectomy    . Tonsillectomy    . Thyroid surgery      No prescriptions prior to admission   Allergies  Allergen Reactions  . Codeine Nausea Only    Social History  Substance Use Topics  . Smoking status: Former Games developermoker  . Smokeless tobacco: Never Used  . Alcohol Use: Yes    Family History  Problem Relation Age of Onset  . Heart disease Mother     After age 79- CHF     Review of Systems  Constitutional: Negative.   HENT: Negative.   Eyes: Negative.   Respiratory: Negative.   Cardiovascular: Negative.   Gastrointestinal: Negative.   Genitourinary: Negative.   Musculoskeletal: Positive for joint pain.  Skin: Negative.   Neurological: Negative.   Endo/Heme/Allergies: Negative.   Psychiatric/Behavioral: Negative.     Objective:  Physical Exam  Constitutional: She is oriented to person, place, and time. She appears well-developed.  HENT:  Head: Normocephalic.  Eyes: Pupils are equal, round, and reactive to light.  Neck: Neck supple. No JVD present. No tracheal deviation present. No thyromegaly present.  Cardiovascular: Normal rate, regular rhythm, normal heart sounds and intact distal pulses.   Respiratory: Effort normal and breath sounds normal. No stridor. No respiratory distress. She has no wheezes.  GI: Soft. There is no tenderness. There is no guarding.  Musculoskeletal:  Left hip: She exhibits decreased range of motion, decreased strength, tenderness and bony tenderness. She exhibits no swelling, no deformity and no laceration.  Lymphadenopathy:    She has no cervical adenopathy.  Neurological: She is alert and oriented to person, place, and time.  Skin: Skin is warm and dry.  Psychiatric: She has a normal mood and affect.      Labs:  Estimated body mass index is 30.54 kg/(m^2) as calculated from the following:   Height as of 10/17/14: 5\' 4"  (1.626 m).   Weight as of 10/17/14: 80.74 kg (178 lb).   Imaging Review Plain radiographs  demonstrate severe degenerative joint disease of the left hip(s). The bone quality appears to be good for age and reported activity level.  Assessment/Plan:  End stage arthritis, left hip(s)  The patient history, physical examination, clinical judgement of the provider and imaging studies are consistent with end stage degenerative joint disease of the left hip(s) and total hip arthroplasty is deemed medically necessary. The treatment options including medical management, injection therapy, arthroscopy and arthroplasty were discussed at length. The risks and benefits of total hip arthroplasty were presented and reviewed. The risks due to aseptic loosening, infection, stiffness, dislocation/subluxation,  thromboembolic complications and other imponderables were discussed.  The patient acknowledged the explanation, agreed to proceed with the plan and consent was signed. Patient is being admitted for inpatient treatment for surgery, pain control, PT, OT, prophylactic antibiotics, VTE prophylaxis, progressive ambulation and ADL's and discharge planning.The patient is planning to be discharged home with home health services.    Anastasio Auerbach Maximiliano Cromartie   PA-C  01/07/2015, 1:42 PM

## 2015-01-08 NOTE — Patient Instructions (Addendum)
YOUR PROCEDURE IS SCHEDULED ON :  01/20/15  REPORT TO [facility] MAIN ENTRANCE FOLLOW SIGNS TO EAST ELEVATOR - GO TO 3rd FLOOR CHECK IN AT 3 EAST NURSES STATION (SHORT STAY) AT:  5:30 AM  CALL THIS NUMBER IF YOU HAVE PROBLEMS THE MORNING OF SURGERY [phone]  REMEMBER:ONLY 1 PER PERSON MAY GO TO SHORT STAY WITH YOU TO GET READY THE MORNING OF YOUR SURGERY  DO NOT EAT FOOD OR DRINK LIQUIDS AFTER MIDNIGHT  TAKE THESE MEDICINES THE MORNING OF SURGERY:   AMLODIPINE / LIPITOR / SYNTHROID  YOU MAY NOT HAVE ANY METAL ON YOUR BODY INCLUDING HAIR PINS AND PIERCING'S. DO NOT WEAR JEWELRY, MAKEUP, LOTIONS, POWDERS OR PERFUMES. DO NOT WEAR NAIL POLISH. DO NOT SHAVE 48 HRS PRIOR TO SURGERY. MEN MAY SHAVE FACE AND NECK.  DO NOT BRING VALUABLES TO HOSPITAL. [facility] IS NOT RESPONSIBLE FOR VALUABLES.  CONTACTS, DENTURES OR PARTIALS MAY NOT BE WORN TO SURGERY. LEAVE SUITCASE IN CAR. CAN BE BROUGHT TO ROOM AFTER SURGERY.  PATIENTS DISCHARGED THE DAY OF SURGERY WILL NOT BE ALLOWED TO DRIVE HOME.  PLEASE READ OVER THE FOLLOWING INSTRUCTION SHEETS _________________________________________________________________________________                                          [facility] - PREPARING FOR SURGERY  Before surgery, you can play an important role.  Because skin is not sterile, your skin needs to be as free of germs as possible.  You can reduce the number of germs on your skin by washing with CHG (chlorahexidine gluconate) soap before surgery.  CHG is an antiseptic cleaner which kills germs and bonds with the skin to continue killing germs even after washing. Please DO NOT use if you have an allergy to CHG or antibacterial soaps.  If your skin becomes reddened/irritated stop using the CHG and inform your nurse when you arrive at Short Stay. Do not shave (including legs and underarms) for at least 48 hours prior to the first CHG shower.  You may shave your face. Please follow  these instructions carefully:   1.  Shower with CHG Soap the night before surgery and the  morning of Surgery.   2.  If you choose to wash your hair, wash your hair first as usual with your  normal  Shampoo.   3.  After you shampoo, rinse your hair and body thoroughly to remove the  shampoo.                                         4.  Use CHG as you would any other liquid soap.  You can apply chg directly  to the skin and wash . Gently wash with scrungie or clean wascloth    5.  Apply the CHG Soap to your body ONLY FROM THE NECK DOWN.   Do not use on open                           Wound or open sores. Avoid contact with eyes, ears mouth and genitals (private parts).                        Genitals (private parts)  with your normal soap.              6.  Wash thoroughly, paying special attention to the area where your surgery  will be performed.   7.  Thoroughly rinse your body with warm water from the neck down.   8.  DO NOT shower/wash with your normal soap after using and rinsing off  the CHG Soap .                9.  Pat yourself dry with a clean towel.             10.  Wear clean night clothes to bed after shower             11.  Place clean sheets on your bed the night of your first shower and do not  sleep with pets.  Day of Surgery : Do not apply any lotions/deodorants the morning of surgery.  Please wear clean clothes to the hospital/surgery center.  FAILURE TO FOLLOW THESE INSTRUCTIONS MAY RESULT IN THE CANCELLATION OF YOUR SURGERY    PATIENT SIGNATURE_________________________________  ______________________________________________________________________     Tammie Santiago  An incentive spirometer is a tool that can help keep your lungs clear and active. This tool measures how well you are filling your lungs with each breath. Taking long deep breaths may help reverse or decrease the chance of developing breathing (pulmonary) problems (especially infection)  following:  A long period of time when you are unable to move or be active. BEFORE THE PROCEDURE   If the spirometer includes an indicator to show your best effort, your nurse or respiratory therapist will set it to a desired goal.  If possible, sit up straight or lean slightly forward. Try not to slouch.  Hold the incentive spirometer in an upright position. INSTRUCTIONS FOR USE   Sit on the edge of your bed if possible, or sit up as far as you can in bed or on a chair.  Hold the incentive spirometer in an upright position.  Breathe out normally.  Place the mouthpiece in your mouth and seal your lips tightly around it.  Breathe in slowly and as deeply as possible, raising the piston or the ball toward the top of the column.  Hold your breath for 3-5 seconds or for as long as possible. Allow the piston or ball to fall to the bottom of the column.  Remove the mouthpiece from your mouth and breathe out normally.  Rest for a few seconds and repeat Steps 1 through 7 at least 10 times every 1-2 hours when you are awake. Take your time and take a few normal breaths between deep breaths.  The spirometer may include an indicator to show your best effort. Use the indicator as a goal to work toward during each repetition.  After each set of 10 deep breaths, practice coughing to be sure your lungs are clear. If you have an incision (the cut made at the time of surgery), support your incision when coughing by placing a pillow or rolled up towels firmly against it. Once you are able to get out of bed, walk around indoors and cough well. You may stop using the incentive spirometer when instructed by your caregiver.  RISKS AND COMPLICATIONS  Take your time so you do not get dizzy or light-headed.  If you are in pain, you may need to take or ask for pain medication before doing incentive spirometry. It is harder to take  a deep breath if you are having pain. AFTER USE  Rest and breathe slowly  and easily.  It can be helpful to keep track of a log of your progress. Your caregiver can provide you with a simple table to help with this. If you are using the spirometer at home, follow these instructions: [place] IF:   You are having difficultly using the spirometer.  You have trouble using the spirometer as often as instructed.  Your pain medication is not giving enough relief while using the spirometer.  You develop fever of 100.5 F (38.1 C) or higher. SEEK IMMEDIATE MEDICAL CARE IF:   You cough up bloody sputum that had not been present before.  You develop fever of 102 F (38.9 C) or greater.  You develop worsening pain at or near the incision site. MAKE SURE YOU:   Understand these instructions.  Will watch your condition.  Will get help right away if you are not doing well or get worse. Document Released: 05/02/2006 Document Revised: 03/14/2011 Document Reviewed: 07/03/2006 Exit[employer] Patient Information 2014 [employer], [place].   ________________________________________________________________________  WHAT IS A BLOOD TRANSFUSION? Blood Transfusion Information  A transfusion is the replacement of blood or some of its parts. Blood is made up of multiple cells which provide different functions.  Red blood cells carry oxygen and are used for blood loss replacement.  White blood cells fight against infection.  Platelets control bleeding.  Plasma helps clot blood.  Other blood products are available for specialized needs, such as hemophilia or other clotting disorders. BEFORE THE TRANSFUSION  Who gives blood for transfusions?   Healthy volunteers who are fully evaluated to make sure their blood is safe. This is blood bank blood. Transfusion therapy is the safest it has ever been in the practice of medicine. Before blood is taken from a donor, a complete history is taken to make sure that person has no history of diseases nor engages in risky social  behavior (examples are intravenous drug use or sexual activity with multiple partners). The donor's travel history is screened to minimize risk of transmitting infections, such as malaria. The donated blood is tested for signs of infectious diseases, such as HIV and hepatitis. The blood is then tested to be sure it is compatible with you in order to minimize the chance of a transfusion reaction. If you or a relative donates blood, this is often done in anticipation of surgery and is not appropriate for emergency situations. It takes many days to process the donated blood. RISKS AND COMPLICATIONS Although transfusion therapy is very safe and saves many lives, the main dangers of transfusion include:   Getting an infectious disease.  Developing a transfusion reaction. This is an allergic reaction to something in the blood you were given. Every precaution is taken to prevent this. The decision to have a blood transfusion has been considered carefully by your caregiver before blood is given. Blood is not given unless the benefits outweigh the risks. AFTER THE TRANSFUSION  Right after receiving a blood transfusion, you will usually feel much better and more energetic. This is especially true if your red blood cells have gotten low (anemic). The transfusion raises the level of the red blood cells which carry oxygen, and this usually causes an energy increase.  The nurse administering the transfusion will monitor you carefully for complications. HOME CARE INSTRUCTIONS  No special instructions are needed after a transfusion. You may find your energy is better. Speak with your caregiver  about any limitations on activity for underlying diseases you may have. SEEK MEDICAL CARE IF:   Your condition is not improving after your transfusion.  You develop redness or irritation at the intravenous (IV) site. SEEK IMMEDIATE MEDICAL CARE IF:  Any of the following symptoms occur over the next 12 hours:  Shaking  chills.  You have a temperature by mouth above 102 F (38.9 C), not controlled by medicine.  Chest, back, or muscle pain.  People around you feel you are not acting correctly or are confused.  Shortness of breath or difficulty breathing.  Dizziness and fainting.  You get a rash or develop hives.  You have a decrease in urine output.  Your urine turns a dark color or changes to pink, red, or brown. Any of the following symptoms occur over the next 10 days:  You have a temperature by mouth above 102 F (38.9 C), not controlled by medicine.  Shortness of breath.  Weakness after normal activity.  The white part of the eye turns yellow (jaundice).  You have a decrease in the amount of urine or are urinating less often.  Your urine turns a dark color or changes to pink, red, or brown. Document Released: 12/18/1999 Document Revised: 03/14/2011 Document Reviewed: 08/06/2007 [facility] Patient Information 2014 [place], [place].  _______________________________________________________________________

## 2015-01-09 ENCOUNTER — Encounter (HOSPITAL_COMMUNITY): Payer: Self-pay

## 2015-01-09 ENCOUNTER — Encounter (HOSPITAL_COMMUNITY)
Admission: RE | Admit: 2015-01-09 | Discharge: 2015-01-09 | Disposition: A | Discharge status: Home / Self Care | Payer: Medicare Other | Source: Ambulatory Visit | Attending: Orthopedic Surgery | Admitting: Orthopedic Surgery

## 2015-01-09 DIAGNOSIS — Z01818 Encounter for other preprocedural examination: Secondary | ICD-10-CM | POA: Insufficient documentation

## 2015-01-09 DIAGNOSIS — M1612 Unilateral primary osteoarthritis, left hip: Secondary | ICD-10-CM | POA: Diagnosis not present

## 2015-01-09 HISTORY — DX: Other complications of anesthesia, initial encounter: T88.59XA

## 2015-01-09 HISTORY — DX: Unspecified osteoarthritis, unspecified site: M19.90

## 2015-01-09 HISTORY — DX: Adverse effect of unspecified anesthetic, initial encounter: T41.45XA

## 2015-01-09 LAB — URINE MICROSCOPIC-ADD ON

## 2015-01-09 LAB — APTT: APTT: 30 s (ref 24–37)

## 2015-01-09 LAB — BASIC METABOLIC PANEL
Anion gap: 9 (ref 5–15)
BUN: 18 mg/dL (ref 6–20)
CALCIUM: 9.6 mg/dL (ref 8.9–10.3)
CO2: 28 mmol/L (ref 22–32)
CREATININE: 0.7 mg/dL (ref 0.44–1.00)
Chloride: 104 mmol/L (ref 101–111)
Glucose, Bld: 84 mg/dL (ref 65–99)
Potassium: 4.4 mmol/L (ref 3.5–5.1)
SODIUM: 141 mmol/L (ref 135–145)

## 2015-01-09 LAB — CBC
HCT: 40 % (ref 36.0–46.0)
Hemoglobin: 13.2 g/dL (ref 12.0–15.0)
MCH: 31.1 pg (ref 26.0–34.0)
MCHC: 33 g/dL (ref 30.0–36.0)
MCV: 94.3 fL (ref 78.0–100.0)
PLATELETS: 207 10*3/uL (ref 150–400)
RBC: 4.24 MIL/uL (ref 3.87–5.11)
RDW: 13.2 % (ref 11.5–15.5)
WBC: 4.9 10*3/uL (ref 4.0–10.5)

## 2015-01-09 LAB — TYPE AND SCREEN
ABO/RH(D): A POS
Antibody Screen: NEGATIVE

## 2015-01-09 LAB — URINALYSIS, ROUTINE W REFLEX MICROSCOPIC
Bilirubin Urine: NEGATIVE
GLUCOSE, UA: NEGATIVE mg/dL
Ketones, ur: NEGATIVE mg/dL
Leukocytes, UA: NEGATIVE
Nitrite: NEGATIVE
PH: 6 (ref 5.0–8.0)
Protein, ur: NEGATIVE mg/dL
SPECIFIC GRAVITY, URINE: 1.017 (ref 1.005–1.030)

## 2015-01-09 LAB — PROTIME-INR
INR: 0.95 (ref 0.00–1.49)
PROTHROMBIN TIME: 12.9 s (ref 11.6–15.2)

## 2015-01-09 LAB — SURGICAL PCR SCREEN
MRSA, PCR: NEGATIVE
Staphylococcus aureus: NEGATIVE

## 2015-01-09 LAB — ABO/RH: ABO/RH(D): A POS

## 2015-01-09 NOTE — Progress Notes (Signed)
UA faxed to Dr. Olin 

## 2015-01-19 NOTE — Anesthesia Preprocedure Evaluation (Addendum)
Anesthesia Evaluation  Patient identified by MRN, date of birth, ID band Patient awake    Reviewed: Allergy & Precautions, NPO status , Patient's Chart, lab work & pertinent test results  History of Anesthesia Complications Negative for: history of anesthetic complications  Airway Mallampati: II  TM Distance: >3 FB Neck ROM: Full    Dental no notable dental hx. (+) Teeth Intact, Dental Advisory Given   Pulmonary former smoker,    Pulmonary exam normal        Cardiovascular hypertension, Pt. on medications Normal cardiovascular exam     Neuro/Psych negative neurological ROS  negative psych ROS   GI/Hepatic negative GI ROS, Neg liver ROS,   Endo/Other  negative endocrine ROS  Renal/GU negative Renal ROS     Musculoskeletal   Abdominal   Peds  Hematology negative hematology ROS (+)   Anesthesia Other Findings   Reproductive/Obstetrics                           Anesthesia Physical Anesthesia Plan  ASA: III  Anesthesia Plan: MAC and Spinal   Post-op Pain Management:    Induction:   Airway Management Planned: Simple Face Mask  Additional Equipment:   Intra-op Plan:   Post-operative Plan: Extubation in OR  Informed Consent: I have reviewed the patients History and Physical, chart, labs and discussed the procedure including the risks, benefits and alternatives for the proposed anesthesia with the patient or authorized representative who has indicated his/her understanding and acceptance.   Dental advisory given  Plan Discussed with: CRNA and Anesthesiologist  Anesthesia Plan Comments:        Anesthesia Quick Evaluation

## 2015-01-20 ENCOUNTER — Inpatient Hospital Stay (HOSPITAL_COMMUNITY): Payer: Medicare Other | Admitting: Anesthesiology

## 2015-01-20 ENCOUNTER — Inpatient Hospital Stay (HOSPITAL_COMMUNITY)
Admission: RE | Admit: 2015-01-20 | Discharge: 2015-01-21 | DRG: 470 | Disposition: A | Discharge status: Home Health | Payer: Medicare Other | Source: Ambulatory Visit | Attending: Orthopedic Surgery | Admitting: Orthopedic Surgery

## 2015-01-20 ENCOUNTER — Encounter (HOSPITAL_COMMUNITY): Payer: Self-pay | Admitting: *Deleted

## 2015-01-20 ENCOUNTER — Inpatient Hospital Stay (HOSPITAL_COMMUNITY): Payer: Medicare Other

## 2015-01-20 ENCOUNTER — Encounter (HOSPITAL_COMMUNITY)
Admission: RE | Disposition: A | Discharge status: Home Health | Payer: Self-pay | Source: Ambulatory Visit | Attending: Orthopedic Surgery

## 2015-01-20 DIAGNOSIS — Z01812 Encounter for preprocedural laboratory examination: Secondary | ICD-10-CM | POA: Diagnosis not present

## 2015-01-20 DIAGNOSIS — Z6830 Body mass index (BMI) 30.0-30.9, adult: Secondary | ICD-10-CM

## 2015-01-20 DIAGNOSIS — E669 Obesity, unspecified: Secondary | ICD-10-CM | POA: Diagnosis present

## 2015-01-20 DIAGNOSIS — M1612 Unilateral primary osteoarthritis, left hip: Principal | ICD-10-CM | POA: Diagnosis present

## 2015-01-20 DIAGNOSIS — M25552 Pain in left hip: Secondary | ICD-10-CM | POA: Diagnosis present

## 2015-01-20 DIAGNOSIS — I10 Essential (primary) hypertension: Secondary | ICD-10-CM | POA: Diagnosis present

## 2015-01-20 DIAGNOSIS — Z87891 Personal history of nicotine dependence: Secondary | ICD-10-CM

## 2015-01-20 DIAGNOSIS — Z96649 Presence of unspecified artificial hip joint: Secondary | ICD-10-CM

## 2015-01-20 HISTORY — PX: TOTAL HIP ARTHROPLASTY: SHX124

## 2015-01-20 SURGERY — ARTHROPLASTY, HIP, TOTAL, ANTERIOR APPROACH
Anesthesia: Monitor Anesthesia Care | Site: Hip | Laterality: Left

## 2015-01-20 MED ORDER — ONDANSETRON HCL 4 MG/2ML IJ SOLN
INTRAMUSCULAR | Status: AC
  Filled 2015-01-20: qty 2

## 2015-01-20 MED ORDER — EPHEDRINE SULFATE 50 MG/ML IJ SOLN
INTRAMUSCULAR | Status: AC
  Filled 2015-01-20: qty 1

## 2015-01-20 MED ORDER — PROPOFOL 10 MG/ML IV BOLUS
INTRAVENOUS | Status: DC | PRN
Start: 2015-01-20 — End: 2015-01-20
  Administered 2015-01-20: 20 mg via INTRAVENOUS

## 2015-01-20 MED ORDER — FERROUS SULFATE 325 (65 FE) MG PO TABS
325.0000 mg | ORAL_TABLET | Freq: Three times a day (TID) | ORAL | Status: DC
  Filled 2015-01-20 (×5): qty 1

## 2015-01-20 MED ORDER — ONDANSETRON HCL 4 MG/2ML IJ SOLN
INTRAMUSCULAR | Status: DC | PRN
  Administered 2015-01-20: 4 mg via INTRAVENOUS

## 2015-01-20 MED ORDER — BUPIVACAINE IN DEXTROSE 0.75-8.25 % IT SOLN
INTRATHECAL | Status: DC | PRN
  Administered 2015-01-20: 1.8 mL via INTRATHECAL

## 2015-01-20 MED ORDER — PROPOFOL 10 MG/ML IV BOLUS
INTRAVENOUS | Status: AC
  Filled 2015-01-20: qty 20

## 2015-01-20 MED ORDER — ONDANSETRON HCL 4 MG/2ML IJ SOLN
4.0000 mg | Freq: Four times a day (QID) | INTRAMUSCULAR | Status: DC | PRN
  Administered 2015-01-20: 4 mg via INTRAVENOUS
  Filled 2015-01-20: qty 2

## 2015-01-20 MED ORDER — DEXAMETHASONE SODIUM PHOSPHATE 10 MG/ML IJ SOLN: 10.0000 mg | Freq: Once | INTRAMUSCULAR | Status: DC

## 2015-01-20 MED ORDER — LACTATED RINGERS IV SOLN
INTRAVENOUS | Status: DC | PRN
  Administered 2015-01-20 (×3): via INTRAVENOUS

## 2015-01-20 MED ORDER — DEXAMETHASONE SODIUM PHOSPHATE 10 MG/ML IJ SOLN
10.0000 mg | Freq: Once | INTRAMUSCULAR | Status: AC
  Administered 2015-01-21: 10 mg via INTRAVENOUS
  Filled 2015-01-20: qty 1

## 2015-01-20 MED ORDER — STERILE WATER FOR IRRIGATION IR SOLN
Status: DC | PRN
Start: 2015-01-20 — End: 2015-01-20
  Administered 2015-01-20: 1500 mL

## 2015-01-20 MED ORDER — PHENYLEPHRINE 40 MCG/ML (10ML) SYRINGE FOR IV PUSH (FOR BLOOD PRESSURE SUPPORT)
PREFILLED_SYRINGE | INTRAVENOUS | Status: AC
  Filled 2015-01-20: qty 20

## 2015-01-20 MED ORDER — ATORVASTATIN CALCIUM 20 MG PO TABS
20.0000 mg | ORAL_TABLET | Freq: Every day | ORAL | Status: DC
  Administered 2015-01-21: 20 mg via ORAL
  Filled 2015-01-20: qty 1

## 2015-01-20 MED ORDER — PHENYLEPHRINE 40 MCG/ML (10ML) SYRINGE FOR IV PUSH (FOR BLOOD PRESSURE SUPPORT)
PREFILLED_SYRINGE | INTRAVENOUS | Status: AC
  Filled 2015-01-20: qty 10

## 2015-01-20 MED ORDER — DOCUSATE SODIUM 100 MG PO CAPS
100.0000 mg | ORAL_CAPSULE | Freq: Two times a day (BID) | ORAL | Status: DC
  Administered 2015-01-20 – 2015-01-21 (×2): 100 mg via ORAL

## 2015-01-20 MED ORDER — BISACODYL 10 MG RE SUPP: 10.0000 mg | Freq: Every day | RECTAL | Status: DC | PRN

## 2015-01-20 MED ORDER — HYDROMORPHONE HCL 1 MG/ML IJ SOLN: 0.5000 mg | INTRAMUSCULAR | Status: DC | PRN

## 2015-01-20 MED ORDER — PROPOFOL 10 MG/ML IV BOLUS
INTRAVENOUS | Status: AC
  Filled 2015-01-20: qty 40

## 2015-01-20 MED ORDER — DEXAMETHASONE SODIUM PHOSPHATE 10 MG/ML IJ SOLN
INTRAMUSCULAR | Status: AC
  Filled 2015-01-20: qty 1

## 2015-01-20 MED ORDER — EPHEDRINE SULFATE 50 MG/ML IJ SOLN
INTRAMUSCULAR | Status: DC | PRN
  Administered 2015-01-20: 5 mg via INTRAVENOUS
  Administered 2015-01-20: 10 mg via INTRAVENOUS
  Administered 2015-01-20: 5 mg via INTRAVENOUS

## 2015-01-20 MED ORDER — SODIUM CHLORIDE 0.9 % IJ SOLN
INTRAMUSCULAR | Status: AC
  Filled 2015-01-20: qty 10

## 2015-01-20 MED ORDER — AMLODIPINE BESYLATE 10 MG PO TABS
10.0000 mg | ORAL_TABLET | Freq: Every day | ORAL | Status: DC
  Administered 2015-01-21: 10 mg via ORAL
  Filled 2015-01-20: qty 1

## 2015-01-20 MED ORDER — ACETAMINOPHEN 500 MG PO TABS
1000.0000 mg | ORAL_TABLET | Freq: Three times a day (TID) | ORAL | Status: DC
  Administered 2015-01-20 – 2015-01-21 (×3): 1000 mg via ORAL
  Filled 2015-01-20 (×6): qty 2

## 2015-01-20 MED ORDER — DEXAMETHASONE SODIUM PHOSPHATE 10 MG/ML IJ SOLN
INTRAMUSCULAR | Status: DC | PRN
  Administered 2015-01-20: 10 mg via INTRAVENOUS

## 2015-01-20 MED ORDER — SODIUM CHLORIDE 0.9 % IR SOLN
Status: DC | PRN
  Administered 2015-01-20: 1000 mL

## 2015-01-20 MED ORDER — POLYETHYLENE GLYCOL 3350 17 G PO PACK
17.0000 g | PACK | Freq: Two times a day (BID) | ORAL | Status: DC
  Administered 2015-01-20: 17 g via ORAL

## 2015-01-20 MED ORDER — TRANEXAMIC ACID 1000 MG/10ML IV SOLN
1000.0000 mg | Freq: Once | INTRAVENOUS | Status: AC
  Administered 2015-01-20: 1000 mg via INTRAVENOUS
  Filled 2015-01-20: qty 10

## 2015-01-20 MED ORDER — METHOCARBAMOL 500 MG PO TABS
500.0000 mg | ORAL_TABLET | Freq: Four times a day (QID) | ORAL | Status: DC | PRN
  Administered 2015-01-21: 500 mg via ORAL
  Filled 2015-01-20 (×2): qty 1

## 2015-01-20 MED ORDER — MENTHOL 3 MG MT LOZG: 1.0000 | LOZENGE | OROMUCOSAL | Status: DC | PRN

## 2015-01-20 MED ORDER — METOCLOPRAMIDE HCL 10 MG PO TABS: 5.0000 mg | ORAL_TABLET | Freq: Three times a day (TID) | ORAL | Status: DC | PRN

## 2015-01-20 MED ORDER — HYDROMORPHONE HCL 1 MG/ML IJ SOLN
INTRAMUSCULAR | Status: AC
  Filled 2015-01-20: qty 1

## 2015-01-20 MED ORDER — ASPIRIN EC 325 MG PO TBEC
325.0000 mg | DELAYED_RELEASE_TABLET | Freq: Two times a day (BID) | ORAL | Status: DC
  Administered 2015-01-21: 325 mg via ORAL
  Filled 2015-01-20 (×3): qty 1

## 2015-01-20 MED ORDER — PHENOL 1.4 % MT LIQD: 1.0000 | OROMUCOSAL | Status: DC | PRN

## 2015-01-20 MED ORDER — METOCLOPRAMIDE HCL 5 MG/ML IJ SOLN
5.0000 mg | Freq: Three times a day (TID) | INTRAMUSCULAR | Status: DC | PRN
  Administered 2015-01-20: 10 mg via INTRAVENOUS
  Filled 2015-01-20: qty 2

## 2015-01-20 MED ORDER — PROMETHAZINE HCL 25 MG/ML IJ SOLN: 6.2500 mg | INTRAMUSCULAR | Status: DC | PRN

## 2015-01-20 MED ORDER — CEFAZOLIN SODIUM-DEXTROSE 2-3 GM-% IV SOLR
2.0000 g | INTRAVENOUS | Status: AC
  Administered 2015-01-20: 2 g via INTRAVENOUS

## 2015-01-20 MED ORDER — LACTATED RINGERS IV SOLN: INTRAVENOUS | Status: DC

## 2015-01-20 MED ORDER — PHENYLEPHRINE HCL 10 MG/ML IJ SOLN
INTRAMUSCULAR | Status: DC | PRN
  Administered 2015-01-20: 120 ug via INTRAVENOUS
  Administered 2015-01-20: 80 ug via INTRAVENOUS
  Administered 2015-01-20: 120 ug via INTRAVENOUS
  Administered 2015-01-20 (×7): 80 ug via INTRAVENOUS

## 2015-01-20 MED ORDER — ALUM & MAG HYDROXIDE-SIMETH 200-200-20 MG/5ML PO SUSP: 30.0000 mL | ORAL | Status: DC | PRN

## 2015-01-20 MED ORDER — HYDROMORPHONE HCL 1 MG/ML IJ SOLN
0.2500 mg | INTRAMUSCULAR | Status: DC | PRN
  Administered 2015-01-20 (×3): 0.5 mg via INTRAVENOUS

## 2015-01-20 MED ORDER — MAGNESIUM CITRATE PO SOLN
1.0000 | Freq: Once | ORAL | Status: DC | PRN
Start: 2015-01-20 — End: 2015-01-21

## 2015-01-20 MED ORDER — CHLORHEXIDINE GLUCONATE 4 % EX LIQD: 60.0000 mL | Freq: Once | CUTANEOUS | Status: DC

## 2015-01-20 MED ORDER — CEFAZOLIN SODIUM-DEXTROSE 2-3 GM-% IV SOLR
INTRAVENOUS | Status: AC
  Filled 2015-01-20: qty 50

## 2015-01-20 MED ORDER — PROPOFOL 500 MG/50ML IV EMUL
INTRAVENOUS | Status: DC | PRN
  Administered 2015-01-20: 100 ug/kg/min via INTRAVENOUS

## 2015-01-20 MED ORDER — TRAMADOL HCL 50 MG PO TABS
50.0000 mg | ORAL_TABLET | Freq: Four times a day (QID) | ORAL | Status: DC
  Administered 2015-01-20 – 2015-01-21 (×5): 100 mg via ORAL
  Filled 2015-01-20 (×5): qty 2

## 2015-01-20 MED ORDER — SODIUM CHLORIDE 0.9 % IV SOLN
100.0000 mL/h | INTRAVENOUS | Status: DC
  Administered 2015-01-20 – 2015-01-21 (×2): 100 mL/h via INTRAVENOUS
  Filled 2015-01-20 (×4): qty 1000

## 2015-01-20 MED ORDER — MIDAZOLAM HCL 5 MG/5ML IJ SOLN
INTRAMUSCULAR | Status: DC | PRN
  Administered 2015-01-20 (×2): 1 mg via INTRAVENOUS

## 2015-01-20 MED ORDER — LIDOCAINE HCL (CARDIAC) 20 MG/ML IV SOLN
INTRAVENOUS | Status: AC
  Filled 2015-01-20: qty 5

## 2015-01-20 MED ORDER — DIPHENHYDRAMINE HCL 25 MG PO CAPS: 25.0000 mg | ORAL_CAPSULE | Freq: Four times a day (QID) | ORAL | Status: DC | PRN

## 2015-01-20 MED ORDER — CEFAZOLIN SODIUM-DEXTROSE 2-3 GM-% IV SOLR
2.0000 g | Freq: Four times a day (QID) | INTRAVENOUS | Status: AC
  Administered 2015-01-20 (×2): 2 g via INTRAVENOUS
  Filled 2015-01-20 (×2): qty 50

## 2015-01-20 MED ORDER — LEVOTHYROXINE SODIUM 75 MCG PO TABS
75.0000 ug | ORAL_TABLET | Freq: Every day | ORAL | Status: DC
  Administered 2015-01-21: 75 ug via ORAL
  Filled 2015-01-20 (×2): qty 1

## 2015-01-20 MED ORDER — METHOCARBAMOL 1000 MG/10ML IJ SOLN
500.0000 mg | Freq: Four times a day (QID) | INTRAVENOUS | Status: DC | PRN
  Administered 2015-01-20: 500 mg via INTRAVENOUS
  Filled 2015-01-20 (×2): qty 5

## 2015-01-20 MED ORDER — MIDAZOLAM HCL 2 MG/2ML IJ SOLN
INTRAMUSCULAR | Status: AC
  Filled 2015-01-20: qty 2

## 2015-01-20 MED ORDER — ONDANSETRON HCL 4 MG PO TABS
4.0000 mg | ORAL_TABLET | Freq: Four times a day (QID) | ORAL | Status: DC | PRN
  Administered 2015-01-21: 4 mg via ORAL
  Filled 2015-01-20: qty 1

## 2015-01-20 MED ORDER — CELECOXIB 200 MG PO CAPS
200.0000 mg | ORAL_CAPSULE | Freq: Two times a day (BID) | ORAL | Status: DC
  Administered 2015-01-20 – 2015-01-21 (×2): 200 mg via ORAL
  Filled 2015-01-20 (×4): qty 1

## 2015-01-20 SURGICAL SUPPLY — 35 items
BAG DECANTER FOR FLEXI CONT (MISCELLANEOUS) IMPLANT
BAG SPEC THK2 15X12 ZIP CLS (MISCELLANEOUS) ×1
BAG ZIPLOCK 12X15 (MISCELLANEOUS) ×2 IMPLANT
CAPT HIP TOTAL 2 ×2 IMPLANT
CLOTH BEACON ORANGE TIMEOUT ST (SAFETY) ×3 IMPLANT
COVER PERINEAL POST (MISCELLANEOUS) ×3 IMPLANT
DRAPE STERI IOBAN 125X83 (DRAPES) ×3 IMPLANT
DRAPE U-SHAPE 47X51 STRL (DRAPES) ×6 IMPLANT
DRSG AQUACEL AG ADV 3.5X10 (GAUZE/BANDAGES/DRESSINGS) ×3 IMPLANT
DURAPREP 26ML APPLICATOR (WOUND CARE) ×3 IMPLANT
ELECT REM PT RETURN 15FT ADLT (MISCELLANEOUS) IMPLANT
ELECT REM PT RETURN 9FT ADLT (ELECTROSURGICAL) ×3
ELECTRODE REM PT RTRN 9FT ADLT (ELECTROSURGICAL) ×1 IMPLANT
GLOVE BIOGEL M STRL SZ7.5 (GLOVE) ×4 IMPLANT
GLOVE BIOGEL PI IND STRL 7.5 (GLOVE) ×1 IMPLANT
GLOVE BIOGEL PI IND STRL 8.5 (GLOVE) ×1 IMPLANT
GLOVE BIOGEL PI INDICATOR 7.5 (GLOVE) ×2
GLOVE BIOGEL PI INDICATOR 8.5 (GLOVE) ×2
GLOVE ECLIPSE 8.0 STRL XLNG CF (GLOVE) ×2 IMPLANT
GLOVE ORTHO TXT STRL SZ7.5 (GLOVE) ×3 IMPLANT
GOWN STRL REUS W/TWL LRG LVL3 (GOWN DISPOSABLE) ×3 IMPLANT
GOWN STRL REUS W/TWL XL LVL3 (GOWN DISPOSABLE) ×7 IMPLANT
HOLDER FOLEY CATH W/STRAP (MISCELLANEOUS) ×3 IMPLANT
LIQUID BAND (GAUZE/BANDAGES/DRESSINGS) ×3 IMPLANT
PACK ANTERIOR HIP CUSTOM (KITS) ×3 IMPLANT
SAW OSC TIP CART 19.5X105X1.3 (SAW) ×3 IMPLANT
SUT MNCRL AB 4-0 PS2 18 (SUTURE) ×3 IMPLANT
SUT VIC AB 1 CT1 36 (SUTURE) ×9 IMPLANT
SUT VIC AB 2-0 CT1 27 (SUTURE) ×6
SUT VIC AB 2-0 CT1 TAPERPNT 27 (SUTURE) ×2 IMPLANT
SUT VLOC 180 0 24IN GS25 (SUTURE) ×3 IMPLANT
TRAY FOLEY W/METER SILVER 14FR (SET/KITS/TRAYS/PACK) ×2 IMPLANT
TRAY FOLEY W/METER SILVER 16FR (SET/KITS/TRAYS/PACK) IMPLANT
WATER STERILE IRR 1500ML POUR (IV SOLUTION) ×3 IMPLANT
YANKAUER SUCT BULB TIP 10FT TU (MISCELLANEOUS) IMPLANT

## 2015-01-20 NOTE — Transfer of Care (Signed)
Immediate Anesthesia Transfer of Care Note  Patient: Tammie Santiago  Procedure(s) Performed: Procedure(s): LEFT TOTAL HIP ARTHROPLASTY ANTERIOR APPROACH (Left)  Patient Location: PACU  Anesthesia Type:Spinal  Level of Consciousness:  Awake, alert, patient cooperative and responds to stimulation  Airway & Oxygen Therapy:Patient Spontanous Breathing and Patient connected to face mask oxgen  Post-op Assessment:  Report given to PACU RN and Post -op Vital signs reviewed and stable  Post vital signs:  Reviewed and stable  Last Vitals:  Filed Vitals:   01/20/15 0440  BP: 159/64  Pulse: 80  Temp: 37.1 C  Resp: 18    Complications: No apparent anesthesia complications

## 2015-01-20 NOTE — Discharge Instructions (Signed)

## 2015-01-20 NOTE — Interval H&P Note (Signed)
History and Physical Interval Note:  01/20/2015 7:07 AM  Tammie Santiago  has presented today for surgery, with the diagnosis of left hip osteoarthritis  The various methods of treatment have been discussed with the patient and family. After consideration of risks, benefits and other options for treatment, the patient has consented to  Procedure(s): LEFT TOTAL HIP ARTHROPLASTY ANTERIOR APPROACH (Left) as a surgical intervention .  The patient's history has been reviewed, patient examined, no change in status, stable for surgery.  I have reviewed the patient's chart and labs.  Questions were answered to the patient's satisfaction.     Shelda Pal

## 2015-01-20 NOTE — Evaluation (Signed)
Physical Therapy Evaluation Patient Details Name: Tammie Santiago MRN: [id number] DOB: 1934/05/08 Today's Date: 01/20/2015   History of Present Illness  81 yo female s/p L THA-direct anterior 01/20/15.  Clinical Impression  On eval POD 0, pt required Min assist for mobility-walked ~50 feet with RW. Pain rated 5/10 with activity. Pt tolerated distance well. Will follow and progress activity as tolerated.     Follow Up Recommendations Home health PT    Equipment Recommendations  Rolling walker with 5" wheels (possibly. Pt wants to try out her 4 wheeled walker first)    Recommendations for Other Services       Precautions / Restrictions Precautions Precautions: Fall Restrictions Weight Bearing Restrictions: No LLE Weight Bearing: Weight bearing as tolerated      Mobility  Bed Mobility Overal bed mobility: Needs Assistance Bed Mobility: Supine to Sit     Supine to sit: Min assist;HOB elevated     General bed mobility comments: Assist for L LE. Increased time.  (will have to practice from high bed/step)  Transfers Overall transfer level: Needs assistance Equipment used: Rolling walker (2 wheeled) Transfers: Sit to/from Stand Sit to Stand: Min assist         General transfer comment: Assist to rise, stabilize, control descent. VCs safety, hand placement.   Ambulation/Gait Ambulation/Gait assistance: Min guard Ambulation Distance (Feet): 50 Feet Assistive device: Rolling walker (2 wheeled) Gait Pattern/deviations: Step-to pattern     General Gait Details: VCs safety, sequence. close guard for safety.   Stairs            Wheelchair Mobility    Modified Rankin (Stroke Patients Only)       Balance                                             Pertinent Vitals/Pain Pain Assessment: 0-10 Pain Score: 5  Pain Location: L hip/LE Pain Descriptors / Indicators: Sore Pain Intervention(s): Monitored during session;Ice  applied;Repositioned    Home Living Family/patient expects to be discharged to:: Private residence Living Arrangements: Spouse/significant other Available Help at Discharge: Family Type of Home: House Home Access: Stairs to enter Entrance Stairs-Rails: Right Entrance Stairs-Number of Steps: 4 Home Layout: Two level;Able to live on main level with bedroom/bathroom Home Equipment: Dan Humphreys - 2 wheels;Cane - single point;Bedside commode      Prior Function Level of Independence: Independent               Hand Dominance        Extremity/Trunk Assessment   Upper Extremity Assessment: Defer to OT evaluation           Lower Extremity Assessment: LLE deficits/detail   LLE Deficits / Details: (at least): hip flex 2/5, hip abd/add 2/5, moves ankle well  Cervical / Trunk Assessment: Normal  Communication   Communication: No difficulties  Cognition Arousal/Alertness: Awake/alert Behavior During Therapy: WFL for tasks assessed/performed Overall Cognitive Status: Within Functional Limits for tasks assessed                      General Comments      Exercises        Assessment/Plan    PT Assessment Patient needs continued PT services  PT Diagnosis Difficulty walking;Acute pain   PT Problem List Decreased strength;Decreased range of motion;Decreased activity tolerance;Decreased balance;Pain;Decreased mobility  PT Treatment Interventions DME  instruction;Gait training;Stair training;Functional mobility training;Therapeutic activities;Patient/family education;Balance training;Therapeutic exercise   PT Goals (Current goals can be found in the Care Plan section) Acute Rehab PT Goals Patient Stated Goal: less pain.  PT Goal Formulation: With patient Time For Goal Achievement: 01/27/15 Potential to Achieve Goals: Good    Frequency 7X/week   Barriers to discharge        Co-evaluation               End of Session   Activity Tolerance: Patient  tolerated treatment well Patient left: in chair;with call bell/phone within reach           Time: 1191-4782 PT Time Calculation (min) (ACUTE ONLY): 20 min   Charges:   PT Evaluation $PT Eval Moderate Complexity: 1 Procedure     PT G Codes:        Rebeca Alert, MPT Pager: [phone]

## 2015-01-20 NOTE — Anesthesia Postprocedure Evaluation (Signed)
Anesthesia Post Note  Patient: Tammie Santiago  Procedure(s) Performed: Procedure(s) (LRB): LEFT TOTAL HIP ARTHROPLASTY ANTERIOR APPROACH (Left)  Patient location during evaluation: PACU Anesthesia Type: General Level of consciousness: sedated Pain management: pain level controlled Vital Signs Assessment: post-procedure vital signs reviewed and stable Respiratory status: spontaneous breathing and respiratory function stable Cardiovascular status: stable Postop Assessment: spinal receding Anesthetic complications: no    Last Vitals:  Filed Vitals:   01/20/15 0440 01/20/15 1037  BP: 159/64 120/52  Pulse: 80 86  Temp: 37.1 C 36.4 C  Resp: 18 15    Last Pain:  Filed Vitals:   01/20/15 1115  PainSc: 0-No pain                 Diallo Ponder DANIEL

## 2015-01-20 NOTE — Progress Notes (Signed)
Utilization review completed.  

## 2015-01-20 NOTE — Anesthesia Procedure Notes (Signed)
Spinal Patient location during procedure: OR Start time: 01/20/2015 8:36 AM End time: 01/20/2015 8:38 AM Reason for block: at surgeon's request Staffing Resident/CRNA: Freddie Breech Performed by: resident/CRNA  Preanesthetic Checklist Completed: patient identified, site marked, surgical consent, pre-op evaluation, timeout performed, IV checked, risks and benefits discussed, monitors and equipment checked and at surgeon's request Spinal Block Patient position: sitting Prep: Betadine Approach: midline Location: L3-4 Injection technique: single-shot Needle Needle type: Sprotte  Needle gauge: 24 G Needle length: 10 cm Assessment Sensory level: T6 Additional Notes Expiration date of kit checked and confirmed. Patient tolerated procedure well, without complications. X 1 attempt with noted clear CSF return. Loss of motor and sensory to bilateral lower extremity on exam post injection.

## 2015-01-20 NOTE — Op Note (Signed)
NAME:  Tammie Santiago                ACCOUNT NO.: [id number]      MEDICAL RECORD NO.: [id number]      FACILITY:  [facility]      PHYSICIAN:  Durene Romans D  DATE OF BIRTH:  25-Nov-1934     DATE OF PROCEDURE:  01/20/2015                                 OPERATIVE REPORT         PREOPERATIVE DIAGNOSIS: Left  hip osteoarthritis.      POSTOPERATIVE DIAGNOSIS:  Left hip osteoarthritis.      PROCEDURE:  Left total hip replacement through an anterior approach   utilizing DePuy THR system, component size 52mm pinnacle cup, a size 36+4 neutral   Altrex liner, a size 2 Hi Tri Lock stem with a 36+5 delta ceramic   ball.      SURGEON:  Madlyn Frankel. Charlann Boxer, M.D.      ASSISTANT:  Skip Mayer, PA-C      ANESTHESIA:  Spinal.      SPECIMENS:  None.      COMPLICATIONS:  None.      BLOOD LOSS:  400 cc     DRAINS:  none     INDICATION OF THE PROCEDURE:  Tammie Santiago is a 81 y.o. female who had   presented to office for evaluation of left hip pain.  Radiographs revealed   progressive degenerative changes with bone-on-bone   articulation to the  hip joint.  The patient had painful limited range of   motion significantly affecting their overall quality of life.  The patient was failing to    respond to conservative measures, and at this point was ready   to proceed with more definitive measures.  The patient has noted progressive   degenerative changes in his hip, progressive problems and dysfunction   with regarding the hip prior to surgery.  Consent was obtained for   benefit of pain relief.  Specific risk of infection, DVT, component   failure, dislocation, need for revision surgery, as well discussion of   the anterior versus posterior approach were reviewed.  Consent was   obtained for benefit of anterior pain relief through an anterior   approach.      PROCEDURE IN DETAIL:  The patient was brought to operative theater.   Once adequate anesthesia,  preoperative antibiotics, 2gm of Ancef, 1 gm of Tranexamic Acid, and 10 mg of Decadron administered.   The patient was positioned supine on the OSI Hanna table.  Once adequate   padding of boney process was carried out, we had predraped out the hip, and  used fluoroscopy to confirm orientation of the pelvis and position.      The left hip was then prepped and draped from proximal iliac crest to   mid thigh with shower curtain technique.      Time-out was performed identifying the patient, planned procedure, and   extremity.     An incision was then made 2 cm distal and lateral to the   anterior superior iliac spine extending over the orientation of the   tensor fascia lata muscle and sharp dissection was carried down to the   fascia of the muscle and protractor placed in the soft tissues.      The fascia was  then incised.  The muscle belly was identified and swept   laterally and retractor placed along the superior neck.  Following   cauterization of the circumflex vessels and removing some pericapsular   fat, a second cobra retractor was placed on the inferior neck.  A third   retractor was placed on the anterior acetabulum after elevating the   anterior rectus.  A L-capsulotomy was along the line of the   superior neck to the trochanteric fossa, then extended proximally and   distally.  Tag sutures were placed and the retractors were then placed   intracapsular.  We then identified the trochanteric fossa and   orientation of my neck cut, confirmed this radiographically   and then made a neck osteotomy with the femur on traction.  The femoral   head was removed without difficulty or complication.  Traction was let   off and retractors were placed posterior and anterior around the   acetabulum.      The labrum and foveal tissue were debrided.  I began reaming with a 45mm   reamer and reamed up to 51mm reamer with good bony bed preparation and a 52mm   cup was chosen.  The final 52mm  Pinnacle cup was then impacted under fluoroscopy  to confirm the depth of penetration and orientation with respect to   abduction.  A screw was placed followed by the hole eliminator.  The final   36+4 neutral Altrex liner was impacted with good visualized rim fit.  The cup was positioned anatomically within the acetabular portion of the pelvis.      At this point, the femur was rolled at 80 degrees.  Further capsule was   released off the inferior aspect of the femoral neck.  I then   released the superior capsule proximally.  The hook was placed laterally   along the femur and elevated manually and held in position with the bed   hook.  The leg was then extended and adducted with the leg rolled to 100   degrees of external rotation.  Once the proximal femur was fully   exposed, I used a box osteotome to set orientation.  I then began   broaching with the starting chili pepper broach and passed this by hand and then broached up to 2.  With the 2 broach in place I chose a high offset neck and did trial reductions.  The offset was appropriate, leg lengths   appeared to be equal best matched using the +5 head ball confirmed radiographically.   Given these findings, I went ahead and dislocated the hip, repositioned all   retractors and positioned the right hip in the extended and abducted position.  The final 2 Hi Tri Lock stem was   chosen and it was impacted down to the level of neck cut.  Based on this   and the trial reduction, a 36+5 delta ceramic ball was chosen and   impacted onto a clean and dry trunnion, and the hip was reduced.  The   hip had been irrigated throughout the case again at this point.  I did   reapproximate the superior capsular leaflet to the anterior leaflet   using #1 Vicryl.  The fascia of the   tensor fascia lata muscle was then reapproximated using #1 Vicryl and #0 V-lock sutures.  The   remaining wound was closed with 2-0 Vicryl and running 4-0 Monocryl.   The hip  was cleaned, dried, and dressed  sterilely using Dermabond and   Aquacel dressing.  She was then brought   to recovery room in stable condition tolerating the procedure well.    Skip Mayer, PA-C was present for the entirety of the case involved from   preoperative positioning, perioperative retractor management, general   facilitation of the case, as well as primary wound closure as assistant.            Madlyn Frankel Charlann Boxer, M.D.        01/20/2015 10:02 AM

## 2015-01-21 DIAGNOSIS — E669 Obesity, unspecified: Secondary | ICD-10-CM | POA: Diagnosis present

## 2015-01-21 LAB — CBC
HEMATOCRIT: 31.3 % — AB (ref 36.0–46.0)
Hemoglobin: 10.6 g/dL — ABNORMAL LOW (ref 12.0–15.0)
MCH: 31.6 pg (ref 26.0–34.0)
MCHC: 33.9 g/dL (ref 30.0–36.0)
MCV: 93.4 fL (ref 78.0–100.0)
PLATELETS: 168 10*3/uL (ref 150–400)
RBC: 3.35 MIL/uL — ABNORMAL LOW (ref 3.87–5.11)
RDW: 13.1 % (ref 11.5–15.5)
WBC: 11.3 10*3/uL — AB (ref 4.0–10.5)

## 2015-01-21 LAB — BASIC METABOLIC PANEL
ANION GAP: 10 (ref 5–15)
BUN: 14 mg/dL (ref 6–20)
CALCIUM: 8.4 mg/dL — AB (ref 8.9–10.3)
CO2: 25 mmol/L (ref 22–32)
CREATININE: 0.58 mg/dL (ref 0.44–1.00)
Chloride: 105 mmol/L (ref 101–111)
Glucose, Bld: 107 mg/dL — ABNORMAL HIGH (ref 65–99)
Potassium: 4.2 mmol/L (ref 3.5–5.1)
SODIUM: 140 mmol/L (ref 135–145)

## 2015-01-21 MED ORDER — ASPIRIN 325 MG PO TBEC: 325.0000 mg | DELAYED_RELEASE_TABLET | Freq: Two times a day (BID) | ORAL | Status: DC

## 2015-01-21 MED ORDER — ONDANSETRON HCL 4 MG PO TABS: 4.0000 mg | ORAL_TABLET | Freq: Four times a day (QID) | ORAL | Status: DC | PRN

## 2015-01-21 MED ORDER — TRAMADOL HCL 50 MG PO TABS: 50.0000 mg | ORAL_TABLET | Freq: Four times a day (QID) | ORAL | Status: DC

## 2015-01-21 MED ORDER — POLYETHYLENE GLYCOL 3350 17 G PO PACK: 17.0000 g | PACK | Freq: Two times a day (BID) | ORAL | Status: DC

## 2015-01-21 MED ORDER — FERROUS SULFATE 325 (65 FE) MG PO TABS: 325.0000 mg | ORAL_TABLET | Freq: Three times a day (TID) | ORAL | Status: DC

## 2015-01-21 MED ORDER — DOCUSATE SODIUM 100 MG PO CAPS: 100.0000 mg | ORAL_CAPSULE | Freq: Two times a day (BID) | ORAL | Status: DC

## 2015-01-21 MED ORDER — TIZANIDINE HCL 4 MG PO TABS: 4.0000 mg | ORAL_TABLET | Freq: Four times a day (QID) | ORAL | Status: DC | PRN

## 2015-01-21 MED ORDER — ACETAMINOPHEN 500 MG PO TABS: 1000.0000 mg | ORAL_TABLET | Freq: Three times a day (TID) | ORAL | Status: DC

## 2015-01-21 NOTE — Progress Notes (Signed)
Physical Therapy Treatment Patient Details Name: Tammie Santiago MRN: [id number] DOB: 1934/07/11 Today's Date: 01/21/2015    History of Present Illness 81 yo female s/p L THA-direct anterior 01/20/15.    PT Comments    Progressing well with mobility. Practiced/reviewed exercises, gait training, stair training, bed mobility. Family present for entire session. All education completed. Ready to d/c from PT standpoint.   Follow Up Recommendations  Home health PT     Equipment Recommendations  Rolling walker with 5" wheels    Recommendations for Other Services       Precautions / Restrictions Precautions Precautions: Fall Restrictions Weight Bearing Restrictions: No LLE Weight Bearing: Weight bearing as tolerated    Mobility  Bed Mobility Overal bed mobility: Needs Assistance Bed Mobility: Supine to Sit;Sit to Supine     Supine to sit: Min guard Sit to supine: Min guard   General bed mobility comments: practiced getting on/off high bed. VCs safety, technique.   Transfers Overall transfer level: Needs assistance Equipment used: Rolling walker (2 wheeled) Transfers: Sit to/from Stand Sit to Stand: Min guard         General transfer comment: close guard for safety  Ambulation/Gait Ambulation/Gait assistance: Min guard Ambulation Distance (Feet): 160 Feet (160'x1, 100'x1) Assistive device: 4-wheeled walker;Rolling walker (2 wheeled) Gait Pattern/deviations: Step-to pattern;Step-through pattern     General Gait Details: Practiced with standard RW vs 4 wheeled RW-pt felt more comfortable with standard RW. close guard for safety.    Stairs Stairs: Yes Stairs assistance: Min guard Stair Management: Step to pattern;Forwards Number of Stairs: 2 (x2) General stair comments: up and over portable steps x2 (once with therapist, once with family). VCs safety, sequence. close guard for safety  Wheelchair Mobility    Modified Rankin (Stroke Patients Only)        Balance                                    Cognition Arousal/Alertness: Awake/alert Behavior During Therapy: WFL for tasks assessed/performed Overall Cognitive Status: Within Functional Limits for tasks assessed                      Exercises Total Joint Exercises Ankle Circles/Pumps: AROM;Both;10 reps;Supine Quad Sets: AROM;Both;10 reps;Supine Hip ABduction/ADduction: AROM;Left;10 reps;Standing Long Arc Quad: AROM;Left;10 reps;Seated Knee Flexion: AROM;Left;10 reps;Standing Marching in Standing: AROM;Left;10 reps;Standing    General Comments        Pertinent Vitals/Pain Pain Score: 6  Pain Location: L hip Pain Descriptors / Indicators: Sore Pain Intervention(s): Monitored during session;Ice applied;Repositioned    Home Living Family/patient expects to be discharged to:: Private residence Living Arrangements: Spouse/significant other Available Help at Discharge: Family         Home Equipment: Dan Humphreys - 2 wheels;Cane - single point;Bedside commode;Grab bars - tub/shower Additional Comments: can borrow a tub seat    Prior Function Level of Independence: Independent          PT Goals (current goals can now be found in the care plan section) Progress towards PT goals: Progressing toward goals    Frequency  7X/week    PT Plan Current plan remains appropriate    Co-evaluation             End of Session Equipment Utilized During Treatment: Gait belt Activity Tolerance: Patient tolerated treatment well Patient left: in chair;with call bell/phone within reach;with family/visitor present     Time:  4782-9562 PT Time Calculation (min) (ACUTE ONLY): 42 min  Charges:  $Gait Training: 23-37 mins $Therapeutic Exercise: 8-22 mins                    G Codes:      Rebeca Alert, MPT Pager: [phone]

## 2015-01-21 NOTE — Evaluation (Signed)
Occupational Therapy Evaluation Patient Details Name: Tammie Santiago MRN: [id number] DOB: Nov 04, 1934 Today's Date: 01/21/2015    History of Present Illness 81 yo female s/p L THA-direct anterior 01/20/15.   Clinical Impression   Pt was admitted for the above surgery. All education was completed.  No further OT is needed at this time    Follow Up Recommendations  No OT follow up    Equipment Recommendations  None recommended by OT    Recommendations for Other Services       Precautions / Restrictions Precautions Precautions: Fall Restrictions Weight Bearing Restrictions: No LLE Weight Bearing: Weight bearing as tolerated      Mobility Bed Mobility   Bed Mobility: Supine to Sit     Supine to sit: Min assist;HOB elevated     General bed mobility comments: assist for LLE  Transfers   Equipment used: Rolling walker (2 wheeled) Transfers: Sit to/from Stand Sit to Stand: Min guard         General transfer comment: for safety      Balance                                            ADL Overall ADL's : Needs assistance/impaired     Grooming: Supervision/safety;Standing   Upper Body Bathing: Set up;Sitting   Lower Body Bathing: Minimal assistance;Sit to/from stand   Upper Body Dressing : Set up;Sitting   Lower Body Dressing: Moderate assistance;Sit to/from stand   Toilet Transfer: Min guard;Ambulation;BSC   Toileting- Clothing Manipulation and Hygiene: Set up;Sit to/from stand         General ADL Comments: husband and daughter will assist as needed with adls.  Pt has a reacher:  educated on use of reacher for adls.  Educated on stepping over tub. She can borrow a Database administrator      Pertinent Vitals/Pain Pain Score: 5  Pain Location: L hip Pain Descriptors / Indicators: Sore Pain Intervention(s): Limited activity within patient's tolerance;Monitored during session;Premedicated before  session;Repositioned;Ice applied     Hand Dominance     Extremity/Trunk Assessment Upper Extremity Assessment Upper Extremity Assessment: Overall WFL for tasks assessed           Communication Communication Communication: No difficulties   Cognition Arousal/Alertness: Awake/alert Behavior During Therapy: WFL for tasks assessed/performed Overall Cognitive Status: Within Functional Limits for tasks assessed                     General Comments       Exercises       Shoulder Instructions      Home Living Family/patient expects to be discharged to:: Private residence Living Arrangements: Spouse/significant other Available Help at Discharge: Family               Bathroom Shower/Tub: Tub/shower unit Shower/tub characteristics: Curtain Firefighter: Standard     Home Equipment: Environmental consultant - 2 wheels;Cane - single point;Bedside commode;Grab bars - tub/shower   Additional Comments: can borrow a tub seat      Prior Functioning/Environment Level of Independence: Independent             OT Diagnosis: Acute pain   OT Problem List:     OT Treatment/Interventions:      OT Goals(Current goals can be found in  the care plan section) Acute Rehab OT Goals OT Goal Formulation: Patient unable to participate in goal setting  OT Frequency:     Barriers to D/C:            Co-evaluation              End of Session    Activity Tolerance: Patient tolerated treatment well Patient left: in chair;with call bell/phone within reach   Time: 0816-0838 OT Time Calculation (min): 22 min Charges:  OT General Charges $OT Visit: 1 Procedure OT Evaluation $OT Eval Low Complexity: 1 Procedure G-Codes:    [name] 01/30/15, 9:54 AM [name], OTR/L [phone] Jan 30, 2015

## 2015-01-21 NOTE — Care Management Note (Signed)
Case Management Note  Patient Details  Name: Tammie Santiago MRN: [id number] Date of Birth: 06-29-1934  Subjective/Objective:                  LEFT TOTAL HIP ARTHROPLASTY ANTERIOR APPROACH (Left)   Action/Plan: Discharge planning  Expected Discharge Date:  01/21/15               Expected Discharge Plan:  [place]  In-House Referral:     Discharge planning Services  CM Consult  Post Acute Care Choice:  Home Health Choice offered to:  Patient  DME Arranged:  Walker rolling DME Agency:  [place] Arranged:  PT [facility] Agency:  [place]  Status of Service:  Completed, signed off  Medicare Important Message Given:    Date Medicare IM Given:    Medicare IM give by:    Date Additional Medicare IM Given:    Additional Medicare Important Message give by:     If discussed at [place] of Stay Meetings, dates discussed:    Additional Comments: CM met with pt in room to offer choice of home health agency.  Pt chooses [facility] to render HHPT.  Referral given to [facility] rep, Tim (on unit).  CM called [facility] DME rep, Lecretia to please deliver the rolling walker to room prior to discharge today.  No other CM needs were communicated. Dellie Catholic, RN 01/21/2015, 11:26 AM

## 2015-01-21 NOTE — Progress Notes (Signed)
     Subjective: 1 Day Post-Op Procedure(s) (LRB): LEFT TOTAL HIP ARTHROPLASTY ANTERIOR APPROACH (Left)   Patient reports pain as mild, pain controlled. Stated that she had some N&V yesterday, but been ok today.  No other events throughout the night. Ready to be discharged home if she does well with PT.   Objective:   VITALS:   Filed Vitals:   01/21/15 0451 01/21/15 0856  BP: 125/50 132/50  Pulse: 70 75  Temp: 97.9 F (36.6 C)   Resp: 16     Dorsiflexion/Plantar flexion intact Incision: dressing C/D/I No cellulitis present Compartment soft  LABS  Recent Labs  01/21/15 0421  HGB 10.6*  HCT 31.3*  WBC 11.3*  PLT 168     Recent Labs  01/21/15 0421  NA 140  K 4.2  BUN 14  CREATININE 0.58  GLUCOSE 107*     Assessment/Plan: 1 Day Post-Op Procedure(s) (LRB): LEFT TOTAL HIP ARTHROPLASTY ANTERIOR APPROACH (Left) Foley cath d/c'ed Advance diet Up with therapy D/C IV fluids Discharge home with home health  Follow up in 2 weeks at [facility]. Follow up with OLIN,Qamar Rosman D in 2 weeks.  Contact information:  [facility] [address], [address] [place] [address] [phone]    Obese (BMI 30-39.9) Estimated body mass index is 30.37 kg/(m^2) as calculated from the following:   Height as of this encounter:  (1.626 m).   Weight as of this encounter: 80.287 kg (177 lb). Patient also counseled that weight may inhibit the healing process Patient counseled that losing weight will help with future health issues       Anastasio Auerbach. Adalyna Godbee   PAC  01/21/2015, 9:35 AM

## 2015-01-26 NOTE — Discharge Summary (Signed)
Physician Discharge Summary  Patient ID: KHADIJA THIER MRN: [id number] DOB/AGE: [birth year] 81 y.o.  Admit date: 01/20/2015 Discharge date: 01/21/2015   Procedures:  Procedure(s) (LRB): LEFT TOTAL HIP ARTHROPLASTY ANTERIOR APPROACH (Left)  Attending Physician:  Dr. Durene Romans   Admission Diagnoses:   Left hip primary OA / pain  Discharge Diagnoses:  Principal Problem:   S/P left THA, AA Active Problems:   Obese  Past Medical History  Diagnosis Date  . High cholesterol   . Hypertension   . Thyroid disease   . Bell's palsy 09/2012    right sided  . Complication of anesthesia     slow to wake up  . Arthritis     HPI:    Tammie Santiago, 81 y.o. female, has a history of pain and functional disability in the left hip(s) due to arthritis and patient has failed non-surgical conservative treatments for greater than 12 weeks to include NSAID's and/or analgesics and activity modification. Onset of symptoms was gradual starting ~4 years ago with gradually worsening course since that time.The patient noted no past surgery on the left hip(s). Patient currently rates pain in the left hip at 7 out of 10 with activity. Patient has worsening of pain with activity and weight bearing, trendelenberg gait, pain that interfers with activities of daily living and pain with passive range of motion. Patient has evidence of periarticular osteophytes and joint space narrowing by imaging studies. This condition presents safety issues increasing the risk of falls. There is no current active infection. Risks, benefits and expectations were discussed with the patient. Risks including but not limited to the risk of anesthesia, blood clots, nerve damage, blood vessel damage, failure of the prosthesis, infection and up to and including death. Patient understand the risks, benefits and expectations and wishes to proceed with surgery.   PCP: Lorenda Peck, MD   Discharged Condition:  good  Hospital Course:  Patient underwent the above stated procedure on 01/20/2015. Patient tolerated the procedure well and brought to the recovery room in good condition and subsequently to the floor.  POD #1 BP: 132/50 ; Pulse: 75 ; Temp: 97.9 F (36.6 C) ; Resp: 16 Patient reports pain as mild, pain controlled. Stated that she had some N&V yesterday, but been ok today. No other events throughout the night. Ready to be discharged home. Dorsiflexion/plantar flexion intact, incision: dressing C/D/I, no cellulitis present and compartment soft.   LABS  Basename    HGB     10.6  HCT     31.3    Discharge Exam: General appearance: alert, cooperative and no distress Extremities: Homans sign is negative, no sign of DVT, no edema, redness or tenderness in the calves or thighs and no ulcers, gangrene or trophic changes  Disposition: Home with follow up in 2 weeks   Follow-up Information    Follow up with Shelda Pal, MD. Schedule an appointment as soon as possible for a visit in 2 weeks.   Specialty:  Orthopedic Surgery   Contact information:   [address] [place] [address] [phone]       Discharge Instructions    Call MD / Call 911    Complete by:  As directed   If you experience chest pain or shortness of breath, CALL 911 and be transported to the hospital emergency room.  If you develope a fever above 101 F, pus (white drainage) or increased drainage or redness at the wound, or calf pain, call  your surgeon's office.     Change dressing    Complete by:  As directed   Maintain surgical dressing until follow up in the clinic. If the edges start to pull up, may reinforce with tape. If the dressing is no longer working, may remove and cover with gauze and tape, but must keep the area dry and clean.  Call with any questions or concerns.     Constipation Prevention    Complete by:  As directed   Drink plenty of fluids.  Prune juice may be helpful.  You may  use a stool softener, such as Colace (over the counter) 100 mg twice a day.  Use MiraLax (over the counter) for constipation as needed.     Diet - low sodium heart healthy    Complete by:  As directed      Discharge instructions    Complete by:  As directed   Maintain surgical dressing until follow up in the clinic. If the edges start to pull up, may reinforce with tape. If the dressing is no longer working, may remove and cover with gauze and tape, but must keep the area dry and clean.  Follow up in 2 weeks at [facility]. Call with any questions or concerns.     Increase activity slowly as tolerated    Complete by:  As directed   Weight bearing as tolerated with assist device (walker, cane, etc) as directed, use it as long as suggested by your surgeon or therapist, typically at least 4-6 weeks.     TED hose    Complete by:  As directed   Use stockings (TED hose) for 2 weeks on both leg(s).  You may remove them at night for sleeping.             Medication List    STOP taking these medications        ibuprofen 200 MG tablet  Commonly known as:  ADVIL,MOTRIN      TAKE these medications        acetaminophen 500 MG tablet  Commonly known as:  TYLENOL  Take 2 tablets (1,000 mg total) by mouth every 8 (eight) hours.     amLODipine 10 MG tablet  Commonly known as:  NORVASC  Take 10 mg by mouth daily.     aspirin 325 MG EC tablet  Take 1 tablet (325 mg total) by mouth 2 (two) times daily.     atorvastatin 40 MG tablet  Commonly known as:  LIPITOR  Take 20 mg by mouth daily.     CALCIUM CARBONATE-VITAMIN D PO  Take 1 tablet by mouth daily.     docusate sodium 100 MG capsule  Commonly known as:  COLACE  Take 1 capsule (100 mg total) by mouth 2 (two) times daily.     ferrous sulfate 325 (65 FE) MG tablet  Take 1 tablet (325 mg total) by mouth 3 (three) times daily after meals.     levothyroxine 75 MCG tablet  Commonly known as:  SYNTHROID, LEVOTHROID  Take 75  mcg by mouth daily before breakfast.     ondansetron 4 MG tablet  Commonly known as:  ZOFRAN  Take 1 tablet (4 mg total) by mouth every 6 (six) hours as needed for nausea.     polyethylene glycol packet  Commonly known as:  MIRALAX / GLYCOLAX  Take 17 g by mouth 2 (two) times daily.     SYSTANE 0.4-0.3 % Soln  Generic drug:  Polyethyl Glycol-Propyl Glycol  Apply 1 drop to eye 2 (two) times daily as needed (for dry eyes).     tiZANidine 4 MG tablet  Commonly known as:  ZANAFLEX  Take 1 tablet (4 mg total) by mouth every 6 (six) hours as needed for muscle spasms.     traMADol 50 MG tablet  Commonly known as:  ULTRAM  Take 1-2 tablets (50-100 mg total) by mouth every 6 (six) hours.         Signed: Anastasio Auerbach. Koran Seabrook   PA-C  01/26/2015, 2:46 PM

## 2015-01-30 ENCOUNTER — Emergency Department (HOSPITAL_COMMUNITY)
Admission: EM | Admit: 2015-01-30 | Discharge: 2015-01-30 | Disposition: A | Discharge status: Home / Self Care | Payer: Medicare Other | Attending: Emergency Medicine | Admitting: Emergency Medicine

## 2015-01-30 ENCOUNTER — Encounter (HOSPITAL_COMMUNITY): Payer: Self-pay | Admitting: Emergency Medicine

## 2015-01-30 DIAGNOSIS — Z79899 Other long term (current) drug therapy: Secondary | ICD-10-CM | POA: Diagnosis not present

## 2015-01-30 DIAGNOSIS — R197 Diarrhea, unspecified: Secondary | ICD-10-CM | POA: Diagnosis not present

## 2015-01-30 DIAGNOSIS — I10 Essential (primary) hypertension: Secondary | ICD-10-CM | POA: Diagnosis not present

## 2015-01-30 DIAGNOSIS — Z96642 Presence of left artificial hip joint: Secondary | ICD-10-CM | POA: Insufficient documentation

## 2015-01-30 DIAGNOSIS — E7800 Pure hypercholesterolemia, unspecified: Secondary | ICD-10-CM | POA: Diagnosis not present

## 2015-01-30 DIAGNOSIS — Z7982 Long term (current) use of aspirin: Secondary | ICD-10-CM | POA: Insufficient documentation

## 2015-01-30 DIAGNOSIS — Z8669 Personal history of other diseases of the nervous system and sense organs: Secondary | ICD-10-CM | POA: Insufficient documentation

## 2015-01-30 DIAGNOSIS — Z87891 Personal history of nicotine dependence: Secondary | ICD-10-CM | POA: Insufficient documentation

## 2015-01-30 DIAGNOSIS — M1990 Unspecified osteoarthritis, unspecified site: Secondary | ICD-10-CM | POA: Diagnosis not present

## 2015-01-30 DIAGNOSIS — E079 Disorder of thyroid, unspecified: Secondary | ICD-10-CM | POA: Diagnosis not present

## 2015-01-30 LAB — CBC WITH DIFFERENTIAL/PLATELET
Basophils Absolute: 0 10*3/uL (ref 0.0–0.1)
Basophils Relative: 0 %
Eosinophils Absolute: 0.3 10*3/uL (ref 0.0–0.7)
Eosinophils Relative: 4 %
HCT: 34.3 % — ABNORMAL LOW (ref 36.0–46.0)
Hemoglobin: 11.3 g/dL — ABNORMAL LOW (ref 12.0–15.0)
Lymphocytes Relative: 15 %
Lymphs Abs: 1 10*3/uL (ref 0.7–4.0)
MCH: 31 pg (ref 26.0–34.0)
MCHC: 32.9 g/dL (ref 30.0–36.0)
MCV: 94.2 fL (ref 78.0–100.0)
Monocytes Absolute: 0.6 10*3/uL (ref 0.1–1.0)
Monocytes Relative: 9 %
Neutro Abs: 5.1 10*3/uL (ref 1.7–7.7)
Neutrophils Relative %: 72 %
Platelets: 252 10*3/uL (ref 150–400)
RBC: 3.64 MIL/uL — ABNORMAL LOW (ref 3.87–5.11)
RDW: 13.7 % (ref 11.5–15.5)
WBC: 7 10*3/uL (ref 4.0–10.5)

## 2015-01-30 LAB — COMPREHENSIVE METABOLIC PANEL
ALT: 35 U/L (ref 14–54)
AST: 30 U/L (ref 15–41)
Albumin: 4.3 g/dL (ref 3.5–5.0)
Alkaline Phosphatase: 101 U/L (ref 38–126)
Anion gap: 13 (ref 5–15)
BUN: 17 mg/dL (ref 6–20)
CO2: 27 mmol/L (ref 22–32)
Calcium: 9.8 mg/dL (ref 8.9–10.3)
Chloride: 102 mmol/L (ref 101–111)
Creatinine, Ser: 0.75 mg/dL (ref 0.44–1.00)
GFR calc Af Amer: >60 mL/min (ref >60)
GFR calc non Af Amer: >60 mL/min (ref >60)
Glucose, Bld: 103 mg/dL — ABNORMAL HIGH (ref 65–99)
Potassium: 4.3 mmol/L (ref 3.5–5.1)
Sodium: 142 mmol/L (ref 135–145)
Total Bilirubin: 0.7 mg/dL (ref 0.3–1.2)
Total Protein: 7.5 g/dL (ref 6.5–8.1)

## 2015-01-30 LAB — URINALYSIS, ROUTINE W REFLEX MICROSCOPIC
Bilirubin Urine: NEGATIVE
Glucose, UA: NEGATIVE mg/dL
Hgb urine dipstick: NEGATIVE
Ketones, ur: NEGATIVE mg/dL
Leukocytes, UA: NEGATIVE
Nitrite: NEGATIVE
Protein, ur: NEGATIVE mg/dL
Specific Gravity, Urine: 1.021 (ref 1.005–1.030)
pH: 5.5 (ref 5.0–8.0)

## 2015-01-30 LAB — LIPASE, BLOOD: Lipase: 20 U/L (ref 11–51)

## 2015-01-30 MED ORDER — LOPERAMIDE HCL 2 MG PO CAPS: 2.0000 mg | ORAL_CAPSULE | Freq: Four times a day (QID) | ORAL | Status: DC | PRN

## 2015-01-30 MED ORDER — SODIUM CHLORIDE 0.9 % IV BOLUS (SEPSIS)
1000.0000 mL | Freq: Once | INTRAVENOUS | Status: AC
  Administered 2015-01-30: 1000 mL via INTRAVENOUS

## 2015-01-30 NOTE — Discharge Instructions (Signed)
Return here as needed.  Follow-up with your primary care Dr. increase your fluid intake and rest as much as possible °

## 2015-01-30 NOTE — ED Notes (Signed)
ED PA at bedside

## 2015-01-30 NOTE — ED Notes (Signed)
Recent hospitalized on 01/20/2015 discharged on 01/21/2015 hip replacement. Diarrhea started Wednesday 01/28/2015. Denies N/V without fever.

## 2015-01-30 NOTE — ED Provider Notes (Signed)
CSN: [id number]     Arrival date & time 01/30/15  1131 History   First MD Initiated Contact with Patient 01/30/15 1157     Chief Complaint  Patient presents with  . Diarrhea     (Consider location/radiation/quality/duration/timing/severity/associated sxs/prior Treatment) HPI Patient presents to the emergency department with diarrhea.  This been ongoing over the last 3 days.  Patient states she had recent hip replacement surgery and was placed on MiraLAX and Colace.  She states that she was not having any constipation issues with this as a prophylactic treatment.  The patient states that nothing seems make the condition better or worse.  She states that she had very large episodes of diarrhea over the last 2 days.  It seemed today to be not a substantial.  Patient denies chest pain, shortness of breath, weakness, dizziness, headache, blurred vision, abdominal pain, nausea, vomiting, chest pain, shortness of breath, rash, near syncope or syncope.  The patient states that she did not take any medications for her symptoms Past Medical History  Diagnosis Date  . High cholesterol   . Hypertension   . Thyroid disease   . Bell's palsy 09/2012    right sided  . Complication of anesthesia     slow to wake up  . Arthritis    Past Surgical History  Procedure Laterality Date  . Rotator cuff surgery  01/2013    bilateral  . Abdominal hysterectomy    . Tonsillectomy    . Thyroid surgery    . Total hip arthroplasty Left 01/20/2015    Procedure: LEFT TOTAL HIP ARTHROPLASTY ANTERIOR APPROACH;  Surgeon: Durene Romans, MD;  Location: [facility];  Service: Orthopedics;  Laterality: Left;   Family History  Problem Relation Age of Onset  . Heart disease Mother     After age 50- CHF   Social History  Substance Use Topics  . Smoking status: Former Games developer  . Smokeless tobacco: Never Used  . Alcohol Use: Yes     Comment: rare   OB History    No data available     Review of Systems  All other  systems negative except as documented in the HPI. All pertinent positives and negatives as reviewed in the HPI.  Allergies  Codeine  Home Medications   Prior to Admission medications   Medication Sig Start Date End Date Taking? Authorizing Provider  acetaminophen (TYLENOL) 500 MG tablet Take 2 tablets (1,000 mg total) by mouth every 8 (eight) hours. 01/21/15  Yes Matthew Babish, PA-C  amLODipine (NORVASC) 10 MG tablet Take 10 mg by mouth daily.    Yes Historical Provider, MD  aspirin EC 325 MG EC tablet Take 1 tablet (325 mg total) by mouth 2 (two) times daily. 01/21/15  Yes Matthew Babish, PA-C  atorvastatin (LIPITOR) 40 MG tablet Take 20 mg by mouth daily.   Yes Historical Provider, MD  CALCIUM CARBONATE-VITAMIN D PO Take 1 tablet by mouth daily.   Yes Historical Provider, MD  ferrous sulfate 325 (65 FE) MG tablet Take 1 tablet (325 mg total) by mouth 3 (three) times daily after meals. 01/21/15  Yes Lanney Gins, PA-C  levothyroxine (SYNTHROID, LEVOTHROID) 75 MCG tablet Take 75 mcg by mouth daily before breakfast.   Yes Historical Provider, MD  ondansetron (ZOFRAN) 4 MG tablet Take 1 tablet (4 mg total) by mouth every 6 (six) hours as needed for nausea. 01/21/15  Yes Lanney Gins, PA-C  tiZANidine (ZANAFLEX) 4 MG tablet Take 1 tablet (4 mg total)  by mouth every 6 (six) hours as needed for muscle spasms. 01/21/15  Yes Lanney Gins, PA-C  traMADol (ULTRAM) 50 MG tablet Take 1-2 tablets (50-100 mg total) by mouth every 6 (six) hours. 01/21/15  Yes Lanney Gins, PA-C  docusate sodium (COLACE) 100 MG capsule Take 1 capsule (100 mg total) by mouth 2 (two) times daily. Patient not taking: Reported on 01/30/2015 01/21/15   Lanney Gins, PA-C  polyethylene glycol (MIRALAX / Ethelene Hal) packet Take 17 g by mouth 2 (two) times daily. Patient not taking: Reported on 01/30/2015 01/21/15   Lanney Gins, PA-C   BP 137/71 mmHg  Pulse 87  Temp(Src) 99.1 F (37.3 C) (Oral)  Resp 16  Ht  (1.626 m)   Wt 79.379 kg  BMI 30.02 kg/m2  SpO2 98% Physical Exam  Constitutional: She is oriented to person, place, and time. She appears well-developed and well-nourished. No distress.  HENT:  Head: Normocephalic and atraumatic.  Mouth/Throat: Oropharynx is clear and moist.  Eyes: Pupils are equal, round, and reactive to light.  Neck: Normal range of motion. Neck supple.  Cardiovascular: Normal rate, regular rhythm and normal heart sounds.  Exam reveals no gallop and no friction rub.   No murmur heard. Pulmonary/Chest: Effort normal and breath sounds normal. No respiratory distress.  Neurological: She is alert and oriented to person, place, and time. She exhibits normal muscle tone. Coordination normal.  Skin: Skin is warm and dry. No rash noted. No erythema.  Nursing note and vitals reviewed.   ED Course  Procedures (including critical care time) Labs Review Labs Reviewed  COMPREHENSIVE METABOLIC PANEL - Abnormal; Notable for the following:    Glucose, Bld 103 (*)    All other components within normal limits  CBC WITH DIFFERENTIAL/PLATELET - Abnormal; Notable for the following:    RBC 3.64 (*)    Hemoglobin 11.3 (*)    HCT 34.3 (*)    All other components within normal limits  URINALYSIS, ROUTINE W REFLEX MICROSCOPIC (NOT AT [facility])  LIPASE, BLOOD    Imaging Review No results found. I have personally reviewed and evaluated these images and lab results as part of my medical decision-making.   EKG Interpretation None      Patient most likely has diarrhea associated with this.  She was given MiraLAX and Colace.  Patient is advised that they continue, she will need to have her doctor drew stool samples but seems unlikely be C. difficile that this is possible she did stay in the hospital.    Charlestine Night, PA-C 01/30/15 1435  Loren Racer, MD 01/30/15 1537

## 2015-01-30 NOTE — ED Notes (Addendum)
Pt had hip replacement on 1/17. Was discharged the following day. Began to have BMs again on Sunday. Since then stool has been increasing in frequency and softness. Turned into diarrhea on Wednesday. Pt has had 3 episodes of dark brown diarrhea since midnight. No longer on stool softeners. No n/v

## 2015-01-30 NOTE — ED Notes (Signed)
Pt ambulated to RR with her walker.

## 2015-10-20 ENCOUNTER — Encounter (HOSPITAL_COMMUNITY): Payer: Medicare Other

## 2015-10-20 ENCOUNTER — Ambulatory Visit: Payer: Medicare Other | Admitting: Family

## 2015-11-03 ENCOUNTER — Encounter: Payer: Self-pay | Admitting: Family

## 2015-12-10 ENCOUNTER — Ambulatory Visit: Payer: Medicare Other | Admitting: Family

## 2015-12-10 ENCOUNTER — Encounter (HOSPITAL_COMMUNITY): Payer: Medicare Other

## 2015-12-18 ENCOUNTER — Telehealth (HOSPITAL_COMMUNITY): Payer: Self-pay | Admitting: *Deleted

## 2015-12-18 NOTE — Telephone Encounter (Signed)
Left my direct line and asked for a return call.

## 2015-12-21 ENCOUNTER — Telehealth (HOSPITAL_COMMUNITY): Payer: Self-pay | Admitting: *Deleted

## 2015-12-21 NOTE — Telephone Encounter (Signed)
In spoke with [name] regarding new surveillance guidelines and to reschedule her January carotid duplex and provider appointment to the q5 schedule.  Patient was very happy about this change.

## 2016-01-15 ENCOUNTER — Inpatient Hospital Stay (HOSPITAL_COMMUNITY)
Admission: RE | Admit: 2016-01-15 | Discharge: 2016-01-15 | Disposition: A | Discharge status: Home / Self Care | Payer: Medicare Other | Source: Ambulatory Visit | Attending: Family | Admitting: Family

## 2016-01-15 ENCOUNTER — Ambulatory Visit: Payer: Medicare Other | Admitting: Family

## 2016-01-15 DIAGNOSIS — I6523 Occlusion and stenosis of bilateral carotid arteries: Secondary | ICD-10-CM

## 2017-08-21 IMAGING — DX DG HIP (WITH OR WITHOUT PELVIS) 1V PORT*L*
2 series · 2 of 2 positions shown · non-contrast
Comparison: 10/28/2014

CLINICAL DATA: Post left hip replacement anterior approach

EXAM:
DG HIP (WITH OR WITHOUT PELVIS) 1V PORT LEFT

[pelvis ap]
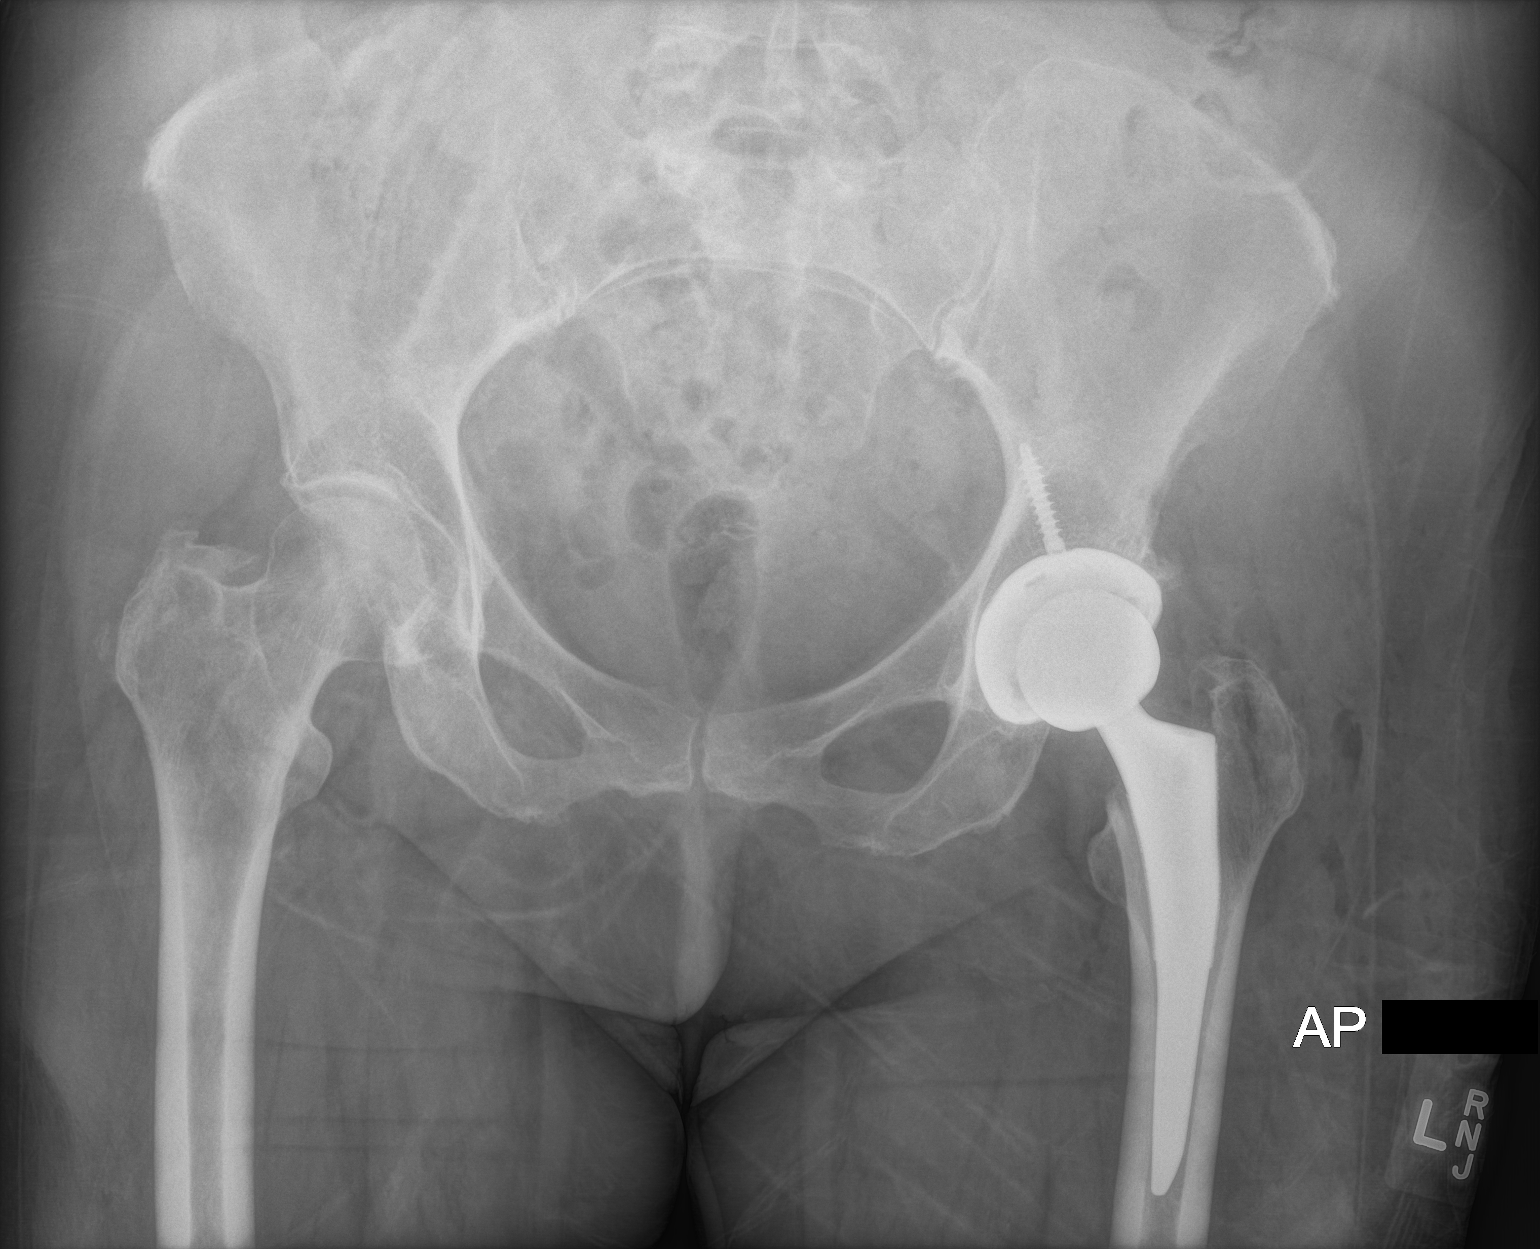

[hip x-table]
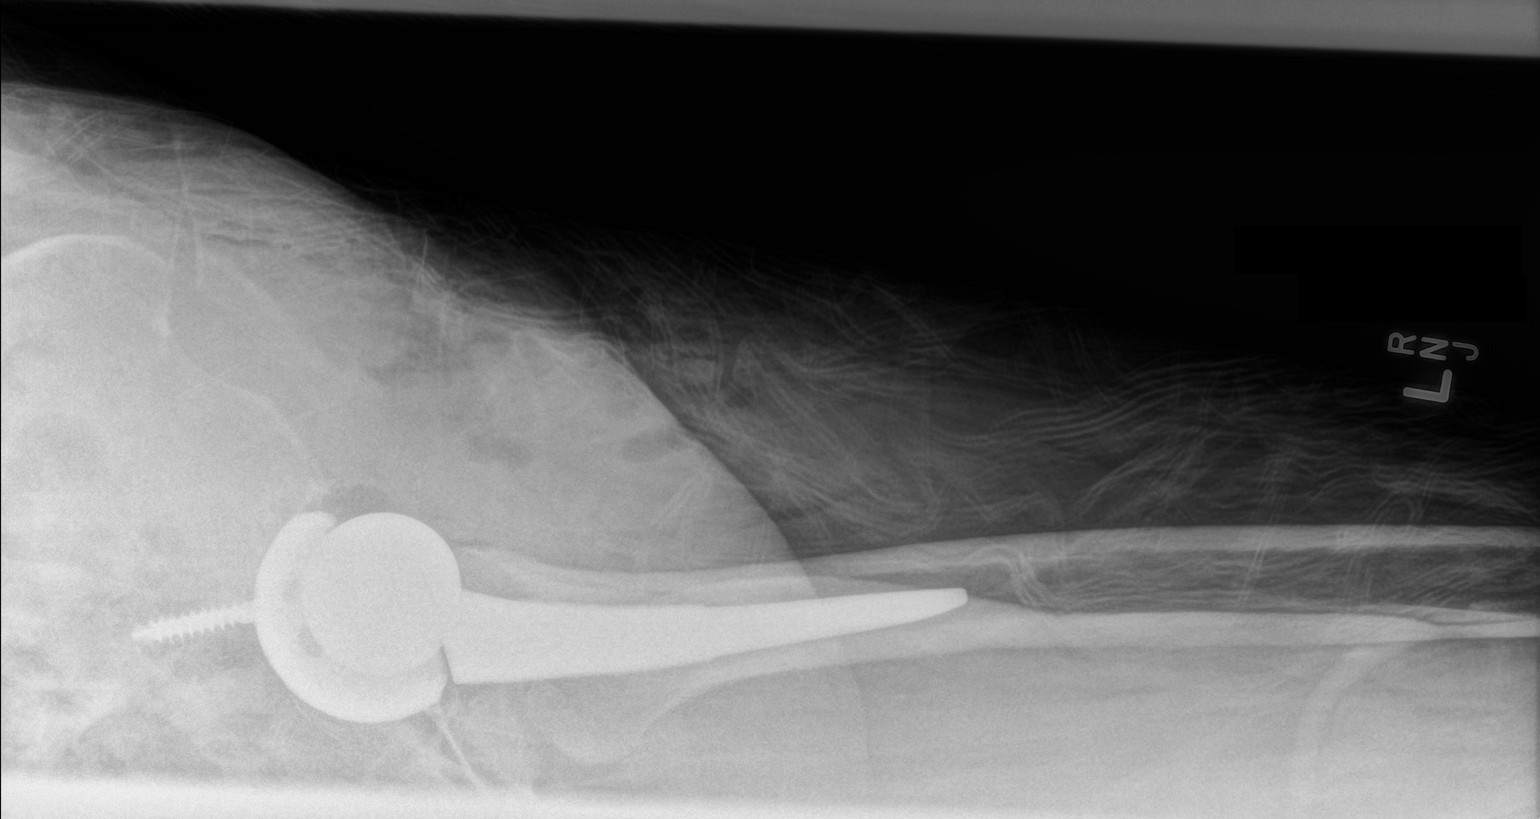

[2 of 2 positions shown; findings below may reference images not displayed]

FINDINGS: Two portable views of the left hip submitted including frontal view
of the pelvis. There is left hip prosthesis with anatomic alignment.
Postsurgical changes are noted with small amount of periarticular
soft tissue air.
IMPRESSION: Left hip prosthesis with anatomic alignment.

## 2017-11-24 DIAGNOSIS — I10 Essential (primary) hypertension: Secondary | ICD-10-CM | POA: Insufficient documentation

## 2017-11-24 DIAGNOSIS — E785 Hyperlipidemia, unspecified: Secondary | ICD-10-CM | POA: Insufficient documentation

## 2017-11-24 DIAGNOSIS — E039 Hypothyroidism, unspecified: Secondary | ICD-10-CM | POA: Insufficient documentation

## 2019-12-23 ENCOUNTER — Other Ambulatory Visit: Payer: Self-pay

## 2019-12-23 DIAGNOSIS — R0989 Other specified symptoms and signs involving the circulatory and respiratory systems: Secondary | ICD-10-CM

## 2020-01-09 ENCOUNTER — Encounter (HOSPITAL_COMMUNITY): Payer: Medicare Other

## 2020-01-09 ENCOUNTER — Ambulatory Visit: Payer: Medicare HMO

## 2021-02-16 ENCOUNTER — Encounter (HOSPITAL_BASED_OUTPATIENT_CLINIC_OR_DEPARTMENT_OTHER): Payer: Self-pay | Admitting: Orthopedic Surgery

## 2021-02-16 ENCOUNTER — Other Ambulatory Visit: Payer: Self-pay | Admitting: Orthopedic Surgery

## 2021-02-16 ENCOUNTER — Other Ambulatory Visit: Payer: Self-pay

## 2021-02-22 NOTE — Progress Notes (Signed)
Printed EKG in chart  and date reviewed with Dr. Hyacinth Meeker, okay to proceed with surgery without getting EKG day of surgery.

## 2021-02-23 ENCOUNTER — Ambulatory Visit (HOSPITAL_BASED_OUTPATIENT_CLINIC_OR_DEPARTMENT_OTHER): Payer: Medicare HMO | Admitting: Certified Registered"

## 2021-02-23 ENCOUNTER — Encounter (HOSPITAL_BASED_OUTPATIENT_CLINIC_OR_DEPARTMENT_OTHER)
Admission: RE | Disposition: A | Discharge status: Home / Self Care | Payer: Self-pay | Source: Home / Self Care | Attending: Orthopedic Surgery

## 2021-02-23 ENCOUNTER — Encounter (HOSPITAL_BASED_OUTPATIENT_CLINIC_OR_DEPARTMENT_OTHER): Payer: Self-pay | Admitting: Orthopedic Surgery

## 2021-02-23 ENCOUNTER — Ambulatory Visit (HOSPITAL_BASED_OUTPATIENT_CLINIC_OR_DEPARTMENT_OTHER)
Admission: RE | Admit: 2021-02-23 | Discharge: 2021-02-23 | Disposition: A | Discharge status: Home / Self Care | Payer: Medicare HMO | Attending: Orthopedic Surgery | Admitting: Orthopedic Surgery

## 2021-02-23 ENCOUNTER — Other Ambulatory Visit: Payer: Self-pay

## 2021-02-23 DIAGNOSIS — E039 Hypothyroidism, unspecified: Secondary | ICD-10-CM

## 2021-02-23 DIAGNOSIS — M1990 Unspecified osteoarthritis, unspecified site: Secondary | ICD-10-CM

## 2021-02-23 DIAGNOSIS — Z87891 Personal history of nicotine dependence: Secondary | ICD-10-CM | POA: Insufficient documentation

## 2021-02-23 DIAGNOSIS — G5602 Carpal tunnel syndrome, left upper limb: Secondary | ICD-10-CM

## 2021-02-23 DIAGNOSIS — I10 Essential (primary) hypertension: Secondary | ICD-10-CM | POA: Insufficient documentation

## 2021-02-23 DIAGNOSIS — E7800 Pure hypercholesterolemia, unspecified: Secondary | ICD-10-CM | POA: Insufficient documentation

## 2021-02-23 HISTORY — DX: Hypothyroidism, unspecified: E03.9

## 2021-02-23 HISTORY — PX: CARPAL TUNNEL RELEASE: SHX101

## 2021-02-23 HISTORY — DX: Hyperlipidemia, unspecified: E78.5

## 2021-02-23 SURGERY — CARPAL TUNNEL RELEASE
Anesthesia: Regional | Site: Wrist | Laterality: Left

## 2021-02-23 MED ORDER — LIDOCAINE HCL (PF) 0.5 % IJ SOLN
INTRAMUSCULAR | Status: DC | PRN
  Administered 2021-02-23: 35 mL via INTRAVENOUS

## 2021-02-23 MED ORDER — CEFAZOLIN SODIUM-DEXTROSE 2-4 GM/100ML-% IV SOLN
INTRAVENOUS | Status: AC
  Filled 2021-02-23: qty 100

## 2021-02-23 MED ORDER — PROMETHAZINE HCL 25 MG/ML IJ SOLN: 6.2500 mg | INTRAMUSCULAR | Status: DC | PRN

## 2021-02-23 MED ORDER — OXYCODONE HCL 5 MG/5ML PO SOLN: 5.0000 mg | Freq: Once | ORAL | Status: DC | PRN

## 2021-02-23 MED ORDER — FENTANYL CITRATE (PF) 100 MCG/2ML IJ SOLN
INTRAMUSCULAR | Status: AC
  Filled 2021-02-23: qty 2

## 2021-02-23 MED ORDER — TRAMADOL HCL 50 MG PO TABS
50.0000 mg | ORAL_TABLET | Freq: Four times a day (QID) | ORAL | 0 refills | Status: DC | PRN
Start: 2021-02-23 — End: 2021-05-10

## 2021-02-23 MED ORDER — FENTANYL CITRATE (PF) 100 MCG/2ML IJ SOLN
INTRAMUSCULAR | Status: DC | PRN
  Administered 2021-02-23 (×2): 50 ug via INTRAVENOUS

## 2021-02-23 MED ORDER — HYDROMORPHONE HCL 1 MG/ML IJ SOLN: 0.2500 mg | INTRAMUSCULAR | Status: DC | PRN

## 2021-02-23 MED ORDER — ONDANSETRON HCL 4 MG/2ML IJ SOLN
INTRAMUSCULAR | Status: DC | PRN
  Administered 2021-02-23: 4 mg via INTRAVENOUS

## 2021-02-23 MED ORDER — OXYCODONE HCL 5 MG PO TABS: 5.0000 mg | ORAL_TABLET | Freq: Once | ORAL | Status: DC | PRN

## 2021-02-23 MED ORDER — 0.9 % SODIUM CHLORIDE (POUR BTL) OPTIME
TOPICAL | Status: DC | PRN
  Administered 2021-02-23: 100 mL

## 2021-02-23 MED ORDER — LACTATED RINGERS IV SOLN: INTRAVENOUS | Status: DC

## 2021-02-23 MED ORDER — PROPOFOL 500 MG/50ML IV EMUL
INTRAVENOUS | Status: DC | PRN
Start: 2021-02-23 — End: 2021-02-23
  Administered 2021-02-23: 100 ug/kg/min via INTRAVENOUS

## 2021-02-23 MED ORDER — CEFAZOLIN SODIUM-DEXTROSE 2-4 GM/100ML-% IV SOLN
2.0000 g | INTRAVENOUS | Status: AC
  Administered 2021-02-23: 2 g via INTRAVENOUS

## 2021-02-23 MED ORDER — BUPIVACAINE HCL (PF) 0.25 % IJ SOLN
INTRAMUSCULAR | Status: DC | PRN
  Administered 2021-02-23: 9 mL

## 2021-02-23 SURGICAL SUPPLY — 34 items
APL PRP STRL LF DISP 70% ISPRP (MISCELLANEOUS) ×1
BLADE SURG 15 STRL LF DISP TIS (BLADE) ×1 IMPLANT
BLADE SURG 15 STRL SS (BLADE) ×2
BNDG CMPR 5X3 CHSV STRCH STRL (GAUZE/BANDAGES/DRESSINGS) ×1
BNDG CMPR 9X4 STRL LF SNTH (GAUZE/BANDAGES/DRESSINGS)
BNDG COHESIVE 3X5 TAN ST LF (GAUZE/BANDAGES/DRESSINGS) ×2 IMPLANT
BNDG ESMARK 4X9 LF (GAUZE/BANDAGES/DRESSINGS) IMPLANT
BNDG GAUZE ELAST 4 BULKY (GAUZE/BANDAGES/DRESSINGS) ×2 IMPLANT
CHLORAPREP W/TINT 26 (MISCELLANEOUS) ×2 IMPLANT
CORD BIPOLAR FORCEPS 12FT (ELECTRODE) ×2 IMPLANT
COVER BACK TABLE 60X90IN (DRAPES) ×2 IMPLANT
COVER MAYO STAND STRL (DRAPES) ×2 IMPLANT
CUFF TOURN SGL QUICK 18X4 (TOURNIQUET CUFF) ×2 IMPLANT
DRAPE EXTREMITY T 121X128X90 (DISPOSABLE) ×2 IMPLANT
DRAPE SURG 17X23 STRL (DRAPES) ×2 IMPLANT
DRSG PAD ABDOMINAL 8X10 ST (GAUZE/BANDAGES/DRESSINGS) ×2 IMPLANT
GAUZE SPONGE 4X4 12PLY STRL (GAUZE/BANDAGES/DRESSINGS) ×2 IMPLANT
GAUZE XEROFORM 1X8 LF (GAUZE/BANDAGES/DRESSINGS) ×2 IMPLANT
GLOVE SURG ORTHO LTX SZ8 (GLOVE) ×2 IMPLANT
GLOVE SURG UNDER POLY LF SZ8.5 (GLOVE) ×2 IMPLANT
GOWN STRL REUS W/ TWL LRG LVL3 (GOWN DISPOSABLE) ×1 IMPLANT
GOWN STRL REUS W/TWL LRG LVL3 (GOWN DISPOSABLE) ×2
GOWN STRL REUS W/TWL XL LVL3 (GOWN DISPOSABLE) ×2 IMPLANT
NDL HYPO 27GX1-1/4 (NEEDLE) IMPLANT
NEEDLE HYPO 27GX1-1/4 (NEEDLE) ×2 IMPLANT
NS IRRIG 1000ML POUR BTL (IV SOLUTION) ×2 IMPLANT
PACK BASIN DAY SURGERY FS (CUSTOM PROCEDURE TRAY) ×2 IMPLANT
STOCKINETTE 4X48 STRL (DRAPES) ×2 IMPLANT
SUT ETHILON 4 0 PS 2 18 (SUTURE) ×2 IMPLANT
SUT VICRYL 4-0 PS2 18IN ABS (SUTURE) IMPLANT
SYR BULB EAR ULCER 3OZ GRN STR (SYRINGE) ×2 IMPLANT
SYR CONTROL 10ML LL (SYRINGE) ×1 IMPLANT
TOWEL GREEN STERILE FF (TOWEL DISPOSABLE) ×2 IMPLANT
UNDERPAD 30X36 HEAVY ABSORB (UNDERPADS AND DIAPERS) ×2 IMPLANT

## 2021-02-23 NOTE — Anesthesia Procedure Notes (Signed)
Anesthesia Regional Block: Bier block (IV Regional)   Pre-Anesthetic Checklist: , timeout performed,  Correct Patient, Correct Site, Correct Laterality,  Correct Procedure, Correct Position, site marked,  Risks and benefits discussed,  Surgical consent,  Pre-op evaluation,  At surgeon's request  Laterality: Left  Prep: alcohol swabs        Procedures:,,,,, intact distal pulses, Esmarch exsanguination,  Single tourniquet utilized,  #20gu IV placed    Narrative:  Injection made incrementally with aspirations every 35 mL.  Performed by: Personally  CRNA: Karen Kitchens, CRNA

## 2021-02-23 NOTE — Anesthesia Preprocedure Evaluation (Signed)
Anesthesia Evaluation  Patient identified by MRN, date of birth, ID band Patient awake    Reviewed: Allergy & Precautions, NPO status , Patient's Chart, lab work & pertinent test results  History of Anesthesia Complications Negative for: history of anesthetic complications  Airway Mallampati: II  TM Distance: >3 FB Neck ROM: Full    Dental no notable dental hx. (+) Teeth Intact, Dental Advisory Given   Pulmonary former smoker,    Pulmonary exam normal        Cardiovascular hypertension, Pt. on medications Normal cardiovascular exam     Neuro/Psych negative neurological ROS  negative psych ROS   GI/Hepatic negative GI ROS, Neg liver ROS,   Endo/Other  Hypothyroidism   Renal/GU negative Renal ROS     Musculoskeletal  (+) Arthritis , Osteoarthritis,    Abdominal (+) + obese,   Peds  Hematology negative hematology ROS (+)   Anesthesia Other Findings   Reproductive/Obstetrics                             Anesthesia Physical  Anesthesia Plan  ASA: III  Anesthesia Plan: Bier Block and Bier Block-LIDOCAINE ONLY   Post-op Pain Management: Regional block* and Minimal or no pain anticipated   Induction: Intravenous  PONV Risk Score and Plan: 2 and Ondansetron, Midazolam and Treatment may vary due to age or medical condition  Airway Management Planned: Simple Face Mask  Additional Equipment:   Intra-op Plan:   Post-operative Plan:   Informed Consent: I have reviewed the patients History and Physical, chart, labs and discussed the procedure including the risks, benefits and alternatives for the proposed anesthesia with the patient or authorized representative who has indicated his/her understanding and acceptance.     Dental advisory given  Plan Discussed with: CRNA and Anesthesiologist  Anesthesia Plan Comments:         Anesthesia Quick Evaluation

## 2021-02-23 NOTE — Discharge Instructions (Addendum)

## 2021-02-23 NOTE — Brief Op Note (Signed)
02/23/2021  9:27 AM  PATIENT:  Tammie Santiago  87 y.o. female  PRE-OPERATIVE DIAGNOSIS:  LEFT CARPAL TUNNEL SYNDROME  POST-OPERATIVE DIAGNOSIS:  LEFT CARPAL TUNNEL SYNDROME  PROCEDURE:  Procedure(s) with comments: CARPAL TUNNEL RELEASE LEFT (Left) - 45 MIN  SURGEON:  Surgeon(s) and Role:    * Cindee Salt, MD - Primary  PHYSICIAN ASSISTANT:   ASSISTANTS: none   ANESTHESIA:   local, regional, and IV sedation  EBL:  5 mL   BLOOD ADMINISTERED:none  DRAINS: none   LOCAL MEDICATIONS USED:  BUPIVICAINE   SPECIMEN:  No Specimen  DISPOSITION OF SPECIMEN:  N/A  COUNTS:  YES  TOURNIQUET:   Total Tourniquet Time Documented: Forearm (Left) - 25 minutes Total: Forearm (Left) - 25 minutes   DICTATION: .Dragon Dictation  PLAN OF CARE: Discharge to home after PACU  PATIENT DISPOSITION:  PACU - hemodynamically stable.

## 2021-02-23 NOTE — Anesthesia Postprocedure Evaluation (Signed)
Anesthesia Post Note  Patient: Tammie Santiago  Procedure(s) Performed: CARPAL TUNNEL RELEASE LEFT (Left: Wrist)     Patient location during evaluation: PACU Anesthesia Type: Bier Block Level of consciousness: awake and alert Pain management: pain level controlled Vital Signs Assessment: post-procedure vital signs reviewed and stable Respiratory status: spontaneous breathing, nonlabored ventilation and respiratory function stable Cardiovascular status: blood pressure returned to baseline and stable Postop Assessment: no apparent nausea or vomiting Anesthetic complications: no   No notable events documented.  Last Vitals:  Vitals:   02/23/21 0930 02/23/21 0951  BP:  139/63  Pulse:  65  Resp:  16  Temp:  36.6 C  SpO2: 93% 95%    Last Pain:  Vitals:   02/23/21 0951  TempSrc: Oral  PainSc: 0-No pain                 Lowella Curb

## 2021-02-23 NOTE — H&P (Signed)
Tammie Santiago is an 87 y.o. female.   Chief Complaint: Numbness and tingling HPI: [name] is an 87 year old right-hand-dominant female referred by Dr. Su Hilt for consultation regarding numbness and tingling and pain right left hand. She is complaining of numbness and tingling specially middle and ring fingers is been going on for slightly over a month began just before Christmas. She has no prior history of injury or problems. She states the numbness is relatively constant. She has no history of injury to the hand or neck. Complains of feeling of stiffness in the morning bell ringing causes problems for her especially with grip and pinch. She was given a she states a steroid which helped but she did not want to continue taking it. She has no history of diabetes arthritis or gout. She has a history of thyroid problems. Family history is negative for each. She was  referred for nerve conductions which have been done by Dr. Neldon Newport revealing a left carpal tunnel syndrome with a motor delay of 6.6 delay of 4 1 and enlargement of the nerve to 13. She has some tempt symptoms at her carpometacarpal joint but her major complaint is to the numbness and tingling. She has been on meloxicam. States her hands continue to bother her on her left side. She has had conservative treatment but not an injection. She has no history of diabetes, arthritis or gout. Does have a history of thyroid problems. Family history is negative Past Medical History:  Diagnosis Date   Arthritis    Bell's palsy 09/03/2012   right sided   Complication of anesthesia    slow to wake up   High cholesterol    Hyperlipidemia    Hypertension    Hypothyroidism    Thyroid disease     Past Surgical History:  Procedure Laterality Date   ABDOMINAL HYSTERECTOMY     rotator cuff surgery  01/2013   bilateral   THYROID SURGERY     TONSILLECTOMY     TOTAL HIP ARTHROPLASTY Left 01/20/2015   Procedure: LEFT TOTAL HIP ARTHROPLASTY  ANTERIOR APPROACH;  Surgeon: Durene Romans, MD;  Location: [facility];  Service: Orthopedics;  Laterality: Left;    Family History  Problem Relation Age of Onset   Heart disease Mother        After age 4- CHF   Social History:  reports that she has quit smoking. She has never used smokeless tobacco. She reports current alcohol use. She reports that she does not use drugs.  Allergies:  Allergies  Allergen Reactions   Codeine Nausea Only    No medications prior to admission.    No results found for this or any previous visit (from the past 48 hour(s)).  No results found.   Pertinent items are noted in HPI.  Height 5\' 3"  (1.6 m), weight 77.1 kg.  General appearance: alert, cooperative, and appears stated age Head: Normocephalic, without obvious abnormality Neck: no JVD Resp: clear to auscultation bilaterally Cardio: regular rate and rhythm, S1, S2 normal, no murmur, click, rub or gallop GI: soft, non-tender; bowel sounds normal; no masses,  no organomegaly Extremities:  numbness Pulses: 2+ and symmetric Skin: Skin color, texture, turgor normal. No rashes or lesions Neurologic: Grossly normal Incision/Wound: na  Assessment/Plan  Diagnosis carpal tunnel syndrome left hand Plan: She would like to proceed to have this surgically released. Pre-. Postoperative course are discussed along with risk and complications. She is aware there is no guarantee to the surgery the possibility of  infection recurrence injury to arteries nerves tendons complete relief symptoms dystrophy. She is scheduled for left carpal tunnel release in outpatient under regional anesthesia. Cindee Salt 02/23/2021, 5:20 AM

## 2021-02-23 NOTE — Op Note (Signed)
NAME: Tammie Santiago MEDICAL RECORD NO: [id number] DATE OF BIRTH: Jul 14, 1934 FACILITY: [facility] LOCATION: [facility] PHYSICIAN: Nicki Reaper, MD   OPERATIVE REPORT   DATE OF PROCEDURE: 02/23/21    PREOPERATIVE DIAGNOSIS: Carpal tunnel syndrome left hand   POSTOPERATIVE DIAGNOSIS: Same   PROCEDURE: Decompression median nerve left   SURGEON: Cindee Salt, M.D.   ASSISTANT: none   ANESTHESIA:  Bier block with sedation and Local   INTRAVENOUS FLUIDS:  Per anesthesia flow sheet.   ESTIMATED BLOOD LOSS:  Minimal.   COMPLICATIONS:  None.   SPECIMENS:  none   TOURNIQUET TIME:    Total Tourniquet Time Documented: Forearm (Left) - 25 minutes Total: Forearm (Left) - 25 minutes    DISPOSITION:  Stable to PACU.   INDICATIONS: Patient is a 88 year old female with a history of numbness and tingling bilateral hands nerve conductions are positive.  This not responded to conservative treatment she is elected undergo surgical decompression of the median nerve.  Pre-.  Postoperative course been discussed along with risks and complications.  She is aware that there is no guarantee to the surgery the possibility of infection recurrence injury to arteries nerves tendons incomplete relief symptoms and dystrophy.  In preoperative area the patient is seen the extremity marked by both patient and surgeon antibiotic given  OPERATIVE COURSE: Patient is brought the operating room placed in the supine position with left arm free.  A forearm IV regional anesthetic was carried out without difficulty under the direction anesthesia department.  She was prepped with ChloraPrep.  A 3-minute dry time was allowed timeout taken confirm patient procedure.  Longitudinal incision was made left palm carried down through subcutaneous tissue.  Bleeders were electrocauterized with bipolar.  Palmar fascia was split.  The superficial palmar arch was identified along the flexor tendon the ring little  finger.  A sect C retractor was placed retracting the flexor tendons median nerve radially ulnar nerve ulnarly with retraction.  The flexor retinaculum was then released on its ulnar border.  The second C and sewell retractor were then placed between skin and forearm fascia deep structures were dissected free with blunt dissection blunt scissors were then used through complete the resection of the flexor retinaculum and distal forearm palmar fascia for approximately 2 cm proximal to the wrist crease under direct vision.  Canal was explored.  Area compression of the nerve was immediately apparent.  Motor branch entered into muscle distally.  The wound was copiously irrigated with saline.  Skin was closed interrupted 4 nylon sutures.  Sterile compressive dressing with the fingers free was applied after injection of the incision site with 9 cc of bupivacaine.  On deflation of the tourniquet all fingers pink and she was taken to the recovery room for observation in satisfactory condition.  She will be discharged home to return to the hand center [facility] in 1 week on Tylenol ibuprofen for pain with Ultram for breakthrough.   Cindee Salt, MD Electronically signed, 02/23/21

## 2021-02-23 NOTE — Transfer of Care (Signed)
Immediate Anesthesia Transfer of Care Note  Patient: Tammie Santiago  Procedure(s) Performed: CARPAL TUNNEL RELEASE LEFT (Left: Wrist)  Patient Location: PACU  Anesthesia Type:MAC and Bier block  Level of Consciousness: awake and alert   Airway & Oxygen Therapy: Patient Spontanous Breathing  Post-op Assessment: Report given to RN and Post -op Vital signs reviewed and stable  Post vital signs: Reviewed and stable  Last Vitals:  Vitals Value Taken Time  BP    Temp    Pulse 66 02/23/21 0924  Resp    SpO2 98 % 02/23/21 0924  Vitals shown include unvalidated device data.  Last Pain:  Vitals:   02/23/21 0732  TempSrc: Oral  PainSc: 0-No pain         Complications: No notable events documented.

## 2021-02-24 ENCOUNTER — Encounter (HOSPITAL_BASED_OUTPATIENT_CLINIC_OR_DEPARTMENT_OTHER): Payer: Self-pay | Admitting: Orthopedic Surgery

## 2021-02-24 NOTE — Progress Notes (Signed)
Left message stating courtesy call and if any questions or concerns please call the doctors office.  

## 2021-05-07 ENCOUNTER — Other Ambulatory Visit: Payer: Self-pay | Admitting: Orthopedic Surgery

## 2021-05-10 ENCOUNTER — Encounter (HOSPITAL_BASED_OUTPATIENT_CLINIC_OR_DEPARTMENT_OTHER): Payer: Self-pay | Admitting: Orthopedic Surgery

## 2021-05-11 ENCOUNTER — Other Ambulatory Visit: Payer: Self-pay

## 2021-05-11 ENCOUNTER — Ambulatory Visit (HOSPITAL_BASED_OUTPATIENT_CLINIC_OR_DEPARTMENT_OTHER): Payer: Medicare HMO | Admitting: Anesthesiology

## 2021-05-11 ENCOUNTER — Encounter (HOSPITAL_BASED_OUTPATIENT_CLINIC_OR_DEPARTMENT_OTHER)
Admission: RE | Disposition: A | Discharge status: Home / Self Care | Payer: Self-pay | Source: Home / Self Care | Attending: Orthopedic Surgery

## 2021-05-11 ENCOUNTER — Encounter (HOSPITAL_BASED_OUTPATIENT_CLINIC_OR_DEPARTMENT_OTHER): Payer: Self-pay | Admitting: Orthopedic Surgery

## 2021-05-11 ENCOUNTER — Ambulatory Visit (HOSPITAL_BASED_OUTPATIENT_CLINIC_OR_DEPARTMENT_OTHER)
Admission: RE | Admit: 2021-05-11 | Discharge: 2021-05-11 | Disposition: A | Discharge status: Home / Self Care | Payer: Medicare HMO | Attending: Orthopedic Surgery | Admitting: Orthopedic Surgery

## 2021-05-11 DIAGNOSIS — Z87891 Personal history of nicotine dependence: Secondary | ICD-10-CM | POA: Insufficient documentation

## 2021-05-11 DIAGNOSIS — E039 Hypothyroidism, unspecified: Secondary | ICD-10-CM | POA: Diagnosis not present

## 2021-05-11 DIAGNOSIS — I10 Essential (primary) hypertension: Secondary | ICD-10-CM | POA: Diagnosis not present

## 2021-05-11 DIAGNOSIS — G5601 Carpal tunnel syndrome, right upper limb: Secondary | ICD-10-CM | POA: Insufficient documentation

## 2021-05-11 HISTORY — PX: CARPAL TUNNEL RELEASE: SHX101

## 2021-05-11 SURGERY — CARPAL TUNNEL RELEASE
Anesthesia: Monitor Anesthesia Care | Site: Hand | Laterality: Right

## 2021-05-11 MED ORDER — PROPOFOL 10 MG/ML IV BOLUS
INTRAVENOUS | Status: DC | PRN
  Administered 2021-05-11: 20 mg via INTRAVENOUS

## 2021-05-11 MED ORDER — LACTATED RINGERS IV SOLN: INTRAVENOUS | Status: DC

## 2021-05-11 MED ORDER — HYDROCODONE-ACETAMINOPHEN 5-325 MG PO TABS: ORAL_TABLET | ORAL | 0 refills | Status: AC

## 2021-05-11 MED ORDER — OXYCODONE HCL 5 MG/5ML PO SOLN: 5.0000 mg | Freq: Once | ORAL | Status: DC | PRN

## 2021-05-11 MED ORDER — PROPOFOL 500 MG/50ML IV EMUL
INTRAVENOUS | Status: DC | PRN
  Administered 2021-05-11: 50 ug/kg/min via INTRAVENOUS

## 2021-05-11 MED ORDER — EPHEDRINE SULFATE (PRESSORS) 50 MG/ML IJ SOLN
INTRAMUSCULAR | Status: DC | PRN
  Administered 2021-05-11: 10 mg via INTRAVENOUS

## 2021-05-11 MED ORDER — FENTANYL CITRATE (PF) 100 MCG/2ML IJ SOLN: 25.0000 ug | INTRAMUSCULAR | Status: DC | PRN

## 2021-05-11 MED ORDER — BUPIVACAINE HCL (PF) 0.25 % IJ SOLN
INTRAMUSCULAR | Status: AC
  Filled 2021-05-11: qty 30

## 2021-05-11 MED ORDER — FENTANYL CITRATE (PF) 100 MCG/2ML IJ SOLN
INTRAMUSCULAR | Status: DC | PRN
  Administered 2021-05-11 (×2): 50 ug via INTRAVENOUS

## 2021-05-11 MED ORDER — ONDANSETRON HCL 4 MG/2ML IJ SOLN: 4.0000 mg | Freq: Once | INTRAMUSCULAR | Status: DC | PRN

## 2021-05-11 MED ORDER — OXYCODONE HCL 5 MG PO TABS: 5.0000 mg | ORAL_TABLET | Freq: Once | ORAL | Status: DC | PRN

## 2021-05-11 MED ORDER — LIDOCAINE HCL (PF) 0.5 % IJ SOLN
INTRAMUSCULAR | Status: DC | PRN
  Administered 2021-05-11: 30 mL via INTRAVENOUS

## 2021-05-11 MED ORDER — 0.9 % SODIUM CHLORIDE (POUR BTL) OPTIME
TOPICAL | Status: DC | PRN
  Administered 2021-05-11: 30 mL

## 2021-05-11 MED ORDER — FENTANYL CITRATE (PF) 100 MCG/2ML IJ SOLN
INTRAMUSCULAR | Status: AC
Start: 2021-05-11 — End: ?
  Filled 2021-05-11: qty 2

## 2021-05-11 MED ORDER — BUPIVACAINE HCL (PF) 0.25 % IJ SOLN
INTRAMUSCULAR | Status: DC | PRN
  Administered 2021-05-11: 9 mL

## 2021-05-11 MED ORDER — ONDANSETRON HCL 4 MG/2ML IJ SOLN
INTRAMUSCULAR | Status: DC | PRN
  Administered 2021-05-11: 4 mg via INTRAVENOUS

## 2021-05-11 SURGICAL SUPPLY — 35 items
APL PRP STRL LF DISP 70% ISPRP (MISCELLANEOUS) ×1
BLADE SURG 15 STRL LF DISP TIS (BLADE) ×2 IMPLANT
BLADE SURG 15 STRL SS (BLADE) ×4
BNDG CMPR 9X4 STRL LF SNTH (GAUZE/BANDAGES/DRESSINGS)
BNDG ELASTIC 3X5.8 VLCR STR LF (GAUZE/BANDAGES/DRESSINGS) ×2 IMPLANT
BNDG ESMARK 4X9 LF (GAUZE/BANDAGES/DRESSINGS) IMPLANT
BNDG GAUZE ELAST 4 BULKY (GAUZE/BANDAGES/DRESSINGS) ×2 IMPLANT
CHLORAPREP W/TINT 26 (MISCELLANEOUS) ×2 IMPLANT
CORD BIPOLAR FORCEPS 12FT (ELECTRODE) ×2 IMPLANT
COVER BACK TABLE 60X90IN (DRAPES) ×2 IMPLANT
COVER MAYO STAND STRL (DRAPES) ×2 IMPLANT
CUFF TOURN SGL QUICK 18X4 (TOURNIQUET CUFF) ×2 IMPLANT
DRAPE EXTREMITY T 121X128X90 (DISPOSABLE) ×2 IMPLANT
DRAPE SURG 17X23 STRL (DRAPES) ×2 IMPLANT
DRSG PAD ABDOMINAL 8X10 ST (GAUZE/BANDAGES/DRESSINGS) ×2 IMPLANT
GAUZE SPONGE 4X4 12PLY STRL (GAUZE/BANDAGES/DRESSINGS) ×2 IMPLANT
GAUZE XEROFORM 1X8 LF (GAUZE/BANDAGES/DRESSINGS) ×2 IMPLANT
GLOVE BIO SURGEON STRL SZ7.5 (GLOVE) ×2 IMPLANT
GLOVE BIOGEL PI IND STRL 8 (GLOVE) ×1 IMPLANT
GLOVE BIOGEL PI INDICATOR 8 (GLOVE) ×1
GOWN STRL REUS W/ TWL LRG LVL3 (GOWN DISPOSABLE) ×1 IMPLANT
GOWN STRL REUS W/TWL LRG LVL3 (GOWN DISPOSABLE) ×2
GOWN STRL REUS W/TWL XL LVL3 (GOWN DISPOSABLE) ×2 IMPLANT
NDL HYPO 25X1 1.5 SAFETY (NEEDLE) ×1 IMPLANT
NEEDLE HYPO 25X1 1.5 SAFETY (NEEDLE) ×2 IMPLANT
NS IRRIG 1000ML POUR BTL (IV SOLUTION) ×2 IMPLANT
PACK BASIN DAY SURGERY FS (CUSTOM PROCEDURE TRAY) ×2 IMPLANT
PADDING CAST ABS 4INX4YD NS (CAST SUPPLIES) ×1
PADDING CAST ABS COTTON 4X4 ST (CAST SUPPLIES) ×1 IMPLANT
STOCKINETTE 4X48 STRL (DRAPES) ×2 IMPLANT
SUT ETHILON 4 0 PS 2 18 (SUTURE) ×2 IMPLANT
SYR BULB EAR ULCER 3OZ GRN STR (SYRINGE) ×2 IMPLANT
SYR CONTROL 10ML LL (SYRINGE) ×2 IMPLANT
TOWEL GREEN STERILE FF (TOWEL DISPOSABLE) ×4 IMPLANT
UNDERPAD 30X36 HEAVY ABSORB (UNDERPADS AND DIAPERS) ×2 IMPLANT

## 2021-05-11 NOTE — H&P (Addendum)
?  Tammie Santiago is an 87 y.o. female.   ?Chief Complaint: right carpal tunnel syndrome ?HPI: 87 yo female with right carpal tunnel syndrome.  Positive nerve conduction studies.  Nocturnal symptoms.  She has had left carpal tunnel release and wishes to proceed with right carpal tunnel release. ? ?Allergies:  ?Allergies  ?Allergen Reactions  ? Codeine Nausea Only  ? ? ?Past Medical History:  ?Diagnosis Date  ? Arthritis   ? Bell's palsy 09/03/2012  ? right sided  ? High cholesterol   ? Hyperlipidemia   ? Hypertension   ? Hypothyroidism   ? Thyroid disease   ? ? ?Past Surgical History:  ?Procedure Laterality Date  ? ABDOMINAL HYSTERECTOMY    ? CARPAL TUNNEL RELEASE Left 02/23/2021  ? Procedure: CARPAL TUNNEL RELEASE LEFT;  Surgeon: Daryll Brod, MD;  Location: [facility];  Service: Orthopedics;  Laterality: Left;  62 MIN  ? rotator cuff surgery  01/2013  ? bilateral  ? THYROID SURGERY    ? TONSILLECTOMY    ? TOTAL HIP ARTHROPLASTY Left 01/20/2015  ? Procedure: LEFT TOTAL HIP ARTHROPLASTY ANTERIOR APPROACH;  Surgeon: Paralee Cancel, MD;  Location: [facility];  Service: Orthopedics;  Laterality: Left;  ? ? ?Family History: ?Family History  ?Problem Relation Age of Onset  ? Heart disease Mother   ?     After age 34- CHF  ? ? ?Social History:  ? reports that she has quit smoking. She has never used smokeless tobacco. She reports current alcohol use. She reports that she does not use drugs. ? ?Medications: ?Medications Prior to Admission  ?Medication Sig Dispense Refill  ? amLODipine (NORVASC) 10 MG tablet Take 10 mg by mouth daily.     ? atorvastatin (LIPITOR) 40 MG tablet Take 20 mg by mouth daily.    ? CALCIUM CARBONATE-VITAMIN D PO Take 1 tablet by mouth daily.    ? celecoxib (CELEBREX) 200 MG capsule Take 200 mg by mouth daily.    ? ibuprofen (ADVIL) 400 MG tablet Take 400 mg by mouth every 6 (six) hours as needed.    ? levothyroxine (SYNTHROID, LEVOTHROID) 75 MCG tablet Take 75 mcg by mouth daily before  breakfast.    ? ? ?No results found for this or any previous visit (from the past 48 hour(s)). ? ?No results found. ? ? ? ?Blood pressure (!) 142/55, pulse 68, temperature 98 ?F (36.7 ?C), temperature source Oral, resp. rate 20, height 5\' 3"  (1.6 m), weight 79.9 kg, SpO2 97 %. ? ?General appearance: alert, cooperative, and appears stated age ?Head: Normocephalic, without obvious abnormality, atraumatic ?Neck: supple, symmetrical, trachea midline ?Extremities: Intact sensation and capillary refill all digits.  +epl/fpl/io.  No wounds.  ?Pulses: 2+ and symmetric ?Skin: Skin color, texture, turgor normal. No rashes or lesions ?Neurologic: Grossly normal ?Incision/Wound: none ? ?Assessment/Plan ?Right carpal tunnel syndrome.  Non operative and operative treatment options have been discussed with the patient and patient wishes to proceed with operative treatment. Risks, benefits and alternatives of surgery were discussed including risks of blood loss, infection, damage to nerves/vessels/tendons/ligament/bone, failure of surgery, need for additional surgery, complication with wound healing, stiffness, recurrence, damage to motor branch.  She voiced understanding of these risks and elected to proceed.   ? ?Leanora Cover ?05/11/2021, 1:17 PM ? ?

## 2021-05-11 NOTE — Discharge Instructions (Addendum)

## 2021-05-11 NOTE — Transfer of Care (Signed)
Immediate Anesthesia Transfer of Care Note ? ?Patient: Tammie Santiago ? ?Procedure(s) Performed: CARPAL TUNNEL RELEASE (Right: Hand) ? ?Patient Location: PACU ? ?Anesthesia Type:MAC and Bier block ? ?Level of Consciousness: awake, alert  and oriented ? ?Airway & Oxygen Therapy: Patient Spontanous Breathing ? ?Post-op Assessment: Report given to RN and Post -op Vital signs reviewed and stable ? ?Post vital signs: Reviewed and stable ? ?Last Vitals:  ?Vitals Value Taken Time  ?BP 108/79 05/11/21 1517  ?Temp    ?Pulse 74 05/11/21 1518  ?Resp    ?SpO2 92 % 05/11/21 1518  ?Vitals shown include unvalidated device data. ? ?Last Pain:  ?Vitals:  ? 05/11/21 1239  ?TempSrc: Oral  ?PainSc: 0-No pain  ?   ? ?Patients Stated Pain Goal: 3 (05/11/21 1239) ? ?Complications: No notable events documented. ?

## 2021-05-11 NOTE — Op Note (Signed)
I assisted Surgeon(s) and Role: ?   Leanora Cover, MD - Primary ?   Daryll Brod, MD - Assisting on the Procedure(s): ?[place] on 05/11/2021.  I provided assistance on this case as follows: setup approach, identification of the nerve,release and closure of the incision and application of the dressings. ? ?Electronically signed by: Daryll Brod, MD ?Date: 05/11/2021 Time: 3:19 PM  ?

## 2021-05-11 NOTE — Anesthesia Procedure Notes (Signed)
Anesthesia Regional Block: Bier block (IV Regional)  ? ?Pre-Anesthetic Checklist: , timeout performed,  Correct Patient, Correct Site, Correct Laterality,  Correct Procedure,, site marked,  Surgical consent,  At surgeon's request ? ?Laterality: Right and Upper ? ?     ?  ?Needles:  ?Injection technique: Single-shot ? ?Needle Type: Other   ? ? ? ?Needle Gauge: 20  ? ? ? ?Additional Needles: ? ? ?Procedures:,,,,, intact distal pulses, Esmarch exsanguination,  Single tourniquet utilized    ?Narrative:  ?Start time: 05/11/2021 2:45 PM ?End time: 05/11/2021 2:45 PM ?Injection made incrementally with aspirations every 30 mL. ? ?Performed by: Personally  ? ? ? ?

## 2021-05-11 NOTE — Op Note (Signed)
05/11/2021 ?[place] ? ?                            OPERATIVE REPORT ? ? ?PREOPERATIVE DIAGNOSIS:  Right carpal tunnel syndrome. ? ?POSTOPERATIVE DIAGNOSIS:  Right carpal tunnel syndrome. ? ?PROCEDURE:  Right carpal tunnel release. ? ?SURGEON:  Leanora Cover, MD ? ?ASSISTANT:  Daryll Brod, MD. ? ?ANESTHESIA: Bier block with sedation ? ?IV FLUIDS:  Per anesthesia flow sheet. ? ?ESTIMATED BLOOD LOSS:  Minimal. ? ?COMPLICATIONS:  None. ? ?SPECIMENS:  None. ? ?TOURNIQUET TIME:   ? ?Total Tourniquet Time Documented: ?area (laterality) - 24 minutes ?Total: area (laterality) - 24 minutes ? ? ?DISPOSITION:  Stable to PACU. ? ?LOCATION: [place] ? ?INDICATIONS:  87 yo female with numbness and tingling right hand.  Positive nerve conduction studies.  Nocturnal symptoms.  She wishes to have a carpal tunnel release for management of her symptoms.  Risks, benefits and alternatives of surgery were discussed including the risk of blood loss; infection; damage to nerves, vessels, tendons, ligaments, bone; failure of surgery; need for additional surgery; complications with wound healing; continued pain; recurrence of carpal tunnel syndrome; and damage to motor branch. She voiced understanding of these risks and elected to proceed.  ? ?OPERATIVE COURSE:  After being identified preoperatively by myself, the patient and I agreed upon the procedure and site of procedure.  The surgical site was marked.  Surgical consent had been signed.  She was given IV Ancef as preoperative antibiotic prophylaxis.  She was transferred to the operating room and placed on the operating room table in supine position with the Right upper extremity on an armboard.  Bier block anesthesia was induced by the anesthesiologist.  Right upper extremity was prepped and draped in normal sterile orthopaedic fashion.  A surgical pause was performed between the surgeons, anesthesia, and operating room staff, and all were in agreement as to  the patient, procedure, and site of procedure.  Tourniquet at the proximal aspect of the forearm had been inflated for the Bier block  Incision was made over the transverse carpal ligament and carried into the subcutaneous tissues by spreading technique.  Bipolar electrocautery was used to obtain hemostasis.  The palmar fascia was sharply incised.  The transverse carpal ligament was identified and sharply incised.  It was incised distally first.  The flexor tendons were identified.  The flexor tendon to the ring finger was identified and retracted radially.  The transverse carpal ligament was then incised proximally.  Scissors were used to split the distal aspect of the volar antebrachial fascia.  A finger was placed into the wound to ensure complete decompression, which was the case.  The nerve was examined.  There was an hourglass deformity.  The motor branch was identified and was intact.  The wound was copiously irrigated with sterile saline.  It was then closed with 4-0 nylon in a horizontal mattress fashion.  It was injected with 0.25% plain Marcaine to aid in postoperative analgesia.  It was dressed with sterile Xeroform, 4x4s, an ABD, and wrapped with Kerlix and an Ace bandage.  Tourniquet was deflated at 24 minutes.  Fingertips were pink with brisk capillary refill after deflation of the tourniquet.  Operative drapes were broken down.  The patient was awoken from anesthesia safely.  She was transferred back to stretcher and taken to the PACU in stable condition.  I will see her back in the office  in 1 week for postoperative followup.  I will give her a prescription for Norco 5/325 1-2 tabs PO q6 hours prn pain, dispense # 15. ? ? ? ?Leanora Cover, MD ?Electronically signed, 05/11/21 ? ?

## 2021-05-11 NOTE — Anesthesia Postprocedure Evaluation (Signed)
Anesthesia Post Note ? ?Patient: Tammie Santiago ? ?Procedure(Santiago) Performed: CARPAL TUNNEL RELEASE (Right: Hand) ? ?  ? ?Patient location during evaluation: PACU ?Anesthesia Type: MAC ?Level of consciousness: awake and alert ?Pain management: pain level controlled ?Vital Signs Assessment: post-procedure vital signs reviewed and stable ?Respiratory status: spontaneous breathing, nonlabored ventilation, respiratory function stable and patient connected to nasal cannula oxygen ?Cardiovascular status: stable and blood pressure returned to baseline ?Postop Assessment: no apparent nausea or vomiting ?Anesthetic complications: no ? ? ?No notable events documented. ? ?Last Vitals:  ?Vitals:  ? 05/11/21 1517 05/11/21 1530  ?BP: 108/79 113/80  ?Pulse: 74 74  ?Resp: 17 19  ?Temp: 36.4 ?C 36.7 ?C  ?SpO2: 93% 92%  ?  ?Last Pain:  ?Vitals:  ? 05/11/21 1530  ?TempSrc:   ?PainSc: 0-No pain  ? ? ?  ?  ?  ?  ?  ?  ? ?Tammie Santiago ? ? ? ? ?

## 2021-05-11 NOTE — Anesthesia Preprocedure Evaluation (Signed)
Anesthesia Evaluation  ?Patient identified by MRN, date of birth, ID band ?Patient awake ? ? ? ?Reviewed: ?Allergy & Precautions, NPO status , Patient's Chart, lab work & pertinent test results ? ?Airway ?Mallampati: II ? ?TM Distance: >3 FB ?Neck ROM: Full ? ? ? Dental ?no notable dental hx. ? ?  ?Pulmonary ?neg pulmonary ROS, former smoker,  ?  ?Pulmonary exam normal ?breath sounds clear to auscultation ? ? ? ? ? ? Cardiovascular ?hypertension, Pt. on medications ?Normal cardiovascular exam ?Rhythm:Regular Rate:Normal ? ? ?  ?Neuro/Psych ?negative neurological ROS ? negative psych ROS  ? GI/Hepatic ?negative GI ROS, Neg liver ROS,   ?Endo/Other  ?Hypothyroidism  ? Renal/GU ?negative Renal ROS  ?negative genitourinary ?  ?Musculoskeletal ?negative musculoskeletal ROS ?(+)  ? Abdominal ?  ?Peds ?negative pediatric ROS ?(+)  Hematology ?negative hematology ROS ?(+)   ?Anesthesia Other Findings ? ? Reproductive/Obstetrics ?negative OB ROS ? ?  ? ? ? ? ? ? ? ? ? ? ? ? ? ?  ?  ? ? ? ? ? ? ? ? ?Anesthesia Physical ?Anesthesia Plan ? ?ASA: 2 ? ?Anesthesia Plan: MAC and Bier Block and Bier Block-LIDOCAINE ONLY  ? ?Post-op Pain Management:   ? ?Induction: Intravenous ? ?PONV Risk Score and Plan: 2 and Propofol infusion and Treatment may vary due to age or medical condition ? ?Airway Management Planned: Simple Face Mask ? ?Additional Equipment:  ? ?Intra-op Plan:  ? ?Post-operative Plan:  ? ?Informed Consent: I have reviewed the patients History and Physical, chart, labs and discussed the procedure including the risks, benefits and alternatives for the proposed anesthesia with the patient or authorized representative who has indicated his/her understanding and acceptance.  ? ? ? ?Dental advisory given ? ?Plan Discussed with: CRNA and Surgeon ? ?Anesthesia Plan Comments:   ? ? ? ? ? ? ?Anesthesia Quick Evaluation ? ?

## 2021-05-12 ENCOUNTER — Encounter (HOSPITAL_BASED_OUTPATIENT_CLINIC_OR_DEPARTMENT_OTHER): Payer: Self-pay | Admitting: Orthopedic Surgery
